# Patient Record
Sex: Male | Born: 1955 | Race: White | Hispanic: No | Marital: Single | Smoking: Current every day smoker
Health system: Southern US, Community
[De-identification: ages and names within clinical notes are randomized; demographics above are authoritative.]

## PROBLEM LIST (undated history)

## (undated) DIAGNOSIS — K219 Gastro-esophageal reflux disease without esophagitis: Secondary | ICD-10-CM

## (undated) DIAGNOSIS — M1990 Unspecified osteoarthritis, unspecified site: Secondary | ICD-10-CM

## (undated) DIAGNOSIS — K7460 Unspecified cirrhosis of liver: Secondary | ICD-10-CM

## (undated) DIAGNOSIS — I517 Cardiomegaly: Secondary | ICD-10-CM

## (undated) DIAGNOSIS — B182 Chronic viral hepatitis C: Secondary | ICD-10-CM

## (undated) DIAGNOSIS — I10 Essential (primary) hypertension: Secondary | ICD-10-CM

## (undated) DIAGNOSIS — Z8701 Personal history of pneumonia (recurrent): Secondary | ICD-10-CM

## (undated) DIAGNOSIS — E079 Disorder of thyroid, unspecified: Secondary | ICD-10-CM

## (undated) DIAGNOSIS — K8020 Calculus of gallbladder without cholecystitis without obstruction: Secondary | ICD-10-CM

## (undated) DIAGNOSIS — E7800 Pure hypercholesterolemia, unspecified: Secondary | ICD-10-CM

## (undated) DIAGNOSIS — F419 Anxiety disorder, unspecified: Secondary | ICD-10-CM

## (undated) DIAGNOSIS — J449 Chronic obstructive pulmonary disease, unspecified: Secondary | ICD-10-CM

## (undated) DIAGNOSIS — K279 Peptic ulcer, site unspecified, unspecified as acute or chronic, without hemorrhage or perforation: Secondary | ICD-10-CM

## (undated) DIAGNOSIS — F319 Bipolar disorder, unspecified: Secondary | ICD-10-CM

## (undated) HISTORY — DX: Gastro-esophageal reflux disease without esophagitis: K21.9

## (undated) HISTORY — DX: Calculus of gallbladder without cholecystitis without obstruction: K80.20

## (undated) HISTORY — PX: TONSILLECTOMY: SUR1361

## (undated) HISTORY — DX: Unspecified osteoarthritis, unspecified site: M19.90

## (undated) HISTORY — DX: Unspecified cirrhosis of liver: K74.60

## (undated) HISTORY — DX: Peptic ulcer, site unspecified, unspecified as acute or chronic, without hemorrhage or perforation: K27.9

## (undated) HISTORY — DX: Chronic viral hepatitis C: B18.2

## (undated) HISTORY — DX: Chronic obstructive pulmonary disease, unspecified: J44.9

## (undated) HISTORY — PX: ESOPHAGOGASTRODUODENOSCOPY (EGD) WITH ESOPHAGEAL DILATION: SHX5812

## (undated) HISTORY — PX: APPENDECTOMY: SHX54

## (undated) HISTORY — DX: Personal history of pneumonia (recurrent): Z87.01

## (undated) HISTORY — DX: Anxiety disorder, unspecified: F41.9

## (undated) HISTORY — DX: Disorder of thyroid, unspecified: E07.9

## (undated) HISTORY — PX: ABDOMINAL EXPLORATION SURGERY: SHX538

## (undated) HISTORY — DX: Bipolar disorder, unspecified: F31.9

---

## 1998-08-21 ENCOUNTER — Emergency Department (HOSPITAL_COMMUNITY): Admission: EM | Admit: 1998-08-21 | Discharge: 1998-08-21 | Payer: Self-pay | Admitting: Emergency Medicine

## 1998-10-02 ENCOUNTER — Emergency Department (HOSPITAL_COMMUNITY): Admission: EM | Admit: 1998-10-02 | Discharge: 1998-10-02 | Payer: Self-pay | Admitting: Emergency Medicine

## 1998-10-02 ENCOUNTER — Encounter: Payer: Self-pay | Admitting: Emergency Medicine

## 1999-11-09 ENCOUNTER — Emergency Department (HOSPITAL_COMMUNITY): Admission: EM | Admit: 1999-11-09 | Discharge: 1999-11-09 | Payer: Self-pay | Admitting: Internal Medicine

## 2005-06-02 ENCOUNTER — Emergency Department (HOSPITAL_COMMUNITY): Admission: EM | Admit: 2005-06-02 | Discharge: 2005-06-02 | Payer: Self-pay | Admitting: Emergency Medicine

## 2005-06-15 ENCOUNTER — Emergency Department (HOSPITAL_COMMUNITY): Admission: EM | Admit: 2005-06-15 | Discharge: 2005-06-15 | Payer: Self-pay | Admitting: Emergency Medicine

## 2005-11-18 ENCOUNTER — Emergency Department (HOSPITAL_COMMUNITY): Admission: EM | Admit: 2005-11-18 | Discharge: 2005-11-18 | Payer: Self-pay | Admitting: Emergency Medicine

## 2005-11-22 ENCOUNTER — Ambulatory Visit: Payer: Self-pay | Admitting: Family Medicine

## 2005-11-24 ENCOUNTER — Ambulatory Visit (HOSPITAL_COMMUNITY): Admission: RE | Admit: 2005-11-24 | Discharge: 2005-11-24 | Payer: Self-pay | Admitting: Sports Medicine

## 2005-12-01 ENCOUNTER — Ambulatory Visit: Payer: Self-pay | Admitting: Family Medicine

## 2011-08-10 HISTORY — PX: HIP SURGERY: SHX245

## 2013-08-09 DIAGNOSIS — Z8701 Personal history of pneumonia (recurrent): Secondary | ICD-10-CM

## 2013-08-09 HISTORY — DX: Personal history of pneumonia (recurrent): Z87.01

## 2016-04-02 ENCOUNTER — Encounter (HOSPITAL_COMMUNITY): Payer: Self-pay | Admitting: Vascular Surgery

## 2016-04-02 ENCOUNTER — Emergency Department (HOSPITAL_COMMUNITY)
Admission: EM | Admit: 2016-04-02 | Discharge: 2016-04-02 | Disposition: A | Discharge status: Home / Self Care | Payer: Medicaid Other | Attending: Emergency Medicine | Admitting: Emergency Medicine

## 2016-04-02 ENCOUNTER — Emergency Department (HOSPITAL_COMMUNITY): Payer: Medicaid Other

## 2016-04-02 DIAGNOSIS — I10 Essential (primary) hypertension: Secondary | ICD-10-CM | POA: Insufficient documentation

## 2016-04-02 DIAGNOSIS — M25562 Pain in left knee: Secondary | ICD-10-CM | POA: Insufficient documentation

## 2016-04-02 DIAGNOSIS — F17210 Nicotine dependence, cigarettes, uncomplicated: Secondary | ICD-10-CM | POA: Insufficient documentation

## 2016-04-02 HISTORY — DX: Cardiomegaly: I51.7

## 2016-04-02 HISTORY — DX: Pure hypercholesterolemia, unspecified: E78.00

## 2016-04-02 HISTORY — DX: Essential (primary) hypertension: I10

## 2016-04-02 MED ORDER — HYDROCODONE-ACETAMINOPHEN 5-325 MG PO TABS: 1.0000 | ORAL_TABLET | Freq: Four times a day (QID) | ORAL | 0 refills | Status: DC | PRN

## 2016-04-02 MED ORDER — HYDROCODONE-ACETAMINOPHEN 5-325 MG PO TABS
1.0000 | ORAL_TABLET | Freq: Once | ORAL | Status: AC
Start: 2016-04-02 — End: 2016-04-02
  Administered 2016-04-02: 1 via ORAL
  Filled 2016-04-02: qty 1

## 2016-04-02 NOTE — ED Triage Notes (Signed)
Pt reports to the ED for eval of left knee pain x 1 year. Pt reports he hurt it July 8th, 2016 but he was in prison so he was not able to get it checked. Pt denies any recent injury. Pt ambulatory without difficulty.

## 2016-04-02 NOTE — Discharge Instructions (Signed)
Read the information below.   Your knee x-ray shows deformity of the bone on the outer aspect of your knee and some fluid around the knee. You are being placed in a knee immobilizer and provided crutches. Please use until evaluated by orthopedics. Be sure to ice and elevate your leg. You can take motrin 400mg  every 6hrs for pain relief. I have prescribed vicodin for severe breakthrough pain. Do not drive after taking.  Use the prescribed medication as directed.  Please discuss all new medications with your pharmacist.  It is important that you follow up with Orthopedics. I have provided the contact information. Please call on Monday.  You may return to the Emergency Department at any time for worsening condition or any new symptoms that concern you. Return to the ED if you develop fever or your knee becomes red/hot/swollen or you are unable to bend your knee.

## 2016-04-02 NOTE — ED Notes (Signed)
Paged ortho tech 

## 2016-04-02 NOTE — Progress Notes (Signed)
Orthopedic Tech Progress Note Patient Details:  Mark ConnorsClarence N Marshall March 28, 1956 [id number]  Ortho Devices Type of Ortho Device: Crutches, Knee Immobilizer Ortho Device/Splint Location: LLE Ortho Device/Splint Interventions: Ordered, Application   Jennye MoccasinHughes, Lancer Thurner Craig 04/02/2016, 9:18 PM
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
## 2016-04-02 NOTE — ED Notes (Signed)
Pt stable, ambulatory, states understanding of discharge instructions 

## 2016-04-29 ENCOUNTER — Telehealth: Payer: Self-pay

## 2016-04-29 NOTE — Telephone Encounter (Signed)
APT. REMINDER CALL, NO ANSWER, NO VOICEMAIL °

## 2016-04-30 ENCOUNTER — Ambulatory Visit (INDEPENDENT_AMBULATORY_CARE_PROVIDER_SITE_OTHER): Payer: Self-pay | Admitting: Internal Medicine

## 2016-04-30 VITALS — BP 137/85 | HR 76 | Temp 98.1°F | Ht 71.0 in | Wt 191.7 lb

## 2016-04-30 DIAGNOSIS — K219 Gastro-esophageal reflux disease without esophagitis: Secondary | ICD-10-CM | POA: Insufficient documentation

## 2016-04-30 DIAGNOSIS — F17210 Nicotine dependence, cigarettes, uncomplicated: Secondary | ICD-10-CM

## 2016-04-30 DIAGNOSIS — I10 Essential (primary) hypertension: Secondary | ICD-10-CM

## 2016-04-30 DIAGNOSIS — B1920 Unspecified viral hepatitis C without hepatic coma: Secondary | ICD-10-CM

## 2016-04-30 DIAGNOSIS — F1121 Opioid dependence, in remission: Secondary | ICD-10-CM

## 2016-04-30 DIAGNOSIS — M1712 Unilateral primary osteoarthritis, left knee: Secondary | ICD-10-CM | POA: Insufficient documentation

## 2016-04-30 DIAGNOSIS — J189 Pneumonia, unspecified organism: Secondary | ICD-10-CM

## 2016-04-30 DIAGNOSIS — J449 Chronic obstructive pulmonary disease, unspecified: Secondary | ICD-10-CM

## 2016-04-30 DIAGNOSIS — Z801 Family history of malignant neoplasm of trachea, bronchus and lung: Secondary | ICD-10-CM

## 2016-04-30 DIAGNOSIS — J984 Other disorders of lung: Secondary | ICD-10-CM

## 2016-04-30 DIAGNOSIS — K279 Peptic ulcer, site unspecified, unspecified as acute or chronic, without hemorrhage or perforation: Secondary | ICD-10-CM | POA: Insufficient documentation

## 2016-04-30 DIAGNOSIS — K8020 Calculus of gallbladder without cholecystitis without obstruction: Secondary | ICD-10-CM | POA: Insufficient documentation

## 2016-04-30 DIAGNOSIS — E039 Hypothyroidism, unspecified: Secondary | ICD-10-CM | POA: Insufficient documentation

## 2016-04-30 DIAGNOSIS — F1421 Cocaine dependence, in remission: Secondary | ICD-10-CM

## 2016-04-30 DIAGNOSIS — Z8349 Family history of other endocrine, nutritional and metabolic diseases: Secondary | ICD-10-CM

## 2016-04-30 DIAGNOSIS — F3170 Bipolar disorder, currently in remission, most recent episode unspecified: Secondary | ICD-10-CM

## 2016-04-30 DIAGNOSIS — K7460 Unspecified cirrhosis of liver: Secondary | ICD-10-CM | POA: Insufficient documentation

## 2016-04-30 DIAGNOSIS — F1021 Alcohol dependence, in remission: Secondary | ICD-10-CM

## 2016-04-30 DIAGNOSIS — Z818 Family history of other mental and behavioral disorders: Secondary | ICD-10-CM

## 2016-04-30 DIAGNOSIS — Z833 Family history of diabetes mellitus: Secondary | ICD-10-CM

## 2016-04-30 DIAGNOSIS — F319 Bipolar disorder, unspecified: Secondary | ICD-10-CM

## 2016-04-30 DIAGNOSIS — F17200 Nicotine dependence, unspecified, uncomplicated: Secondary | ICD-10-CM

## 2016-04-30 DIAGNOSIS — Z79899 Other long term (current) drug therapy: Secondary | ICD-10-CM

## 2016-04-30 MED ORDER — QUETIAPINE FUMARATE 200 MG PO TABS: 200.0000 mg | ORAL_TABLET | Freq: Every day | ORAL | 0 refills | Status: DC

## 2016-04-30 MED ORDER — PRAZOSIN HCL 1 MG PO CAPS: 1.0000 mg | ORAL_CAPSULE | Freq: Every day | ORAL | 0 refills | Status: DC

## 2016-04-30 MED ORDER — OMEPRAZOLE 20 MG PO CPDR
20.0000 mg | DELAYED_RELEASE_CAPSULE | Freq: Every day | ORAL | 0 refills | Status: DC
Start: 2016-04-30 — End: 2016-05-11

## 2016-04-30 MED ORDER — LEVOTHYROXINE SODIUM 125 MCG PO TABS: 125.0000 ug | ORAL_TABLET | Freq: Every day | ORAL | 0 refills | Status: DC

## 2016-04-30 MED ORDER — LISINOPRIL 10 MG PO TABS: 20.0000 mg | ORAL_TABLET | Freq: Every day | ORAL | 0 refills | Status: DC

## 2016-04-30 MED ORDER — QUETIAPINE FUMARATE 50 MG PO TABS: 50.0000 mg | ORAL_TABLET | Freq: Every morning | ORAL | 0 refills | Status: DC

## 2016-04-30 MED ORDER — ALBUTEROL SULFATE HFA 108 (90 BASE) MCG/ACT IN AERS: 2.0000 | INHALATION_SPRAY | Freq: Four times a day (QID) | RESPIRATORY_TRACT | 0 refills | Status: DC | PRN

## 2016-04-30 MED ORDER — UMECLIDINIUM-VILANTEROL 62.5-25 MCG/INH IN AEPB: 1.0000 | INHALATION_SPRAY | Freq: Every day | RESPIRATORY_TRACT | 0 refills | Status: DC

## 2016-04-30 MED ORDER — PROPRANOLOL HCL 10 MG PO TABS: 10.0000 mg | ORAL_TABLET | Freq: Two times a day (BID) | ORAL | 0 refills | Status: DC
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
## 2016-04-30 NOTE — Progress Notes (Signed)
Patient was informed of the MedAssist medication assistance program and was provided forms. Pt signed forms and affirmed address and number correct. Will fax on behalf of patient.  Bea GraffLeah Dayleen Beske PharmD Candidate, c/o 2019 04/30/2016 3:46 PM

## 2016-04-30 NOTE — Assessment & Plan Note (Signed)
A: Patient was told by prison doctors that he has cirrhosis.  Unclear if due to Hep C or EtOH.  His discharge medication list from prison lists lactulose 20gm BID and propanolol 10mg  BID indicating possible varices.  He does not recall ever being told he has varices but he does report multiple EGD's while incarcerated. He has been without lactulose since release and reports doing well in terms of daily bowel movements.  There is no evidence of encephalopathy, asterixis, or jaundice on exam today.  CT abdomen in 2007 noted a normal appearing liver.  CT chest in 06/2014 made the comment of query hepatic cirrhosis.   P:  - continue propanolol 10mg  BID - consider restarting lactulose if needed in the future - would check a CMET, PT/INR at follow up once [employer] Card approved.  Would also consider abdominal ultrasound and referral to GI for consideration of EGD to assess varices depending on documentation and imaging results as well as time from last variceal screening if he does have cirrhosis.

## 2016-04-30 NOTE — Assessment & Plan Note (Signed)
A: Patient is currently followed by [facility] for this.  He brings in prescriptions for Quetiapine 50mg  QAM and 200mg  QHS.  He also is taking Prazosin 1mg  QHS also prescribed by [facility].  There is a strong family history of bipolar disorder and other mental health disease.  P: - continue follow up with [facility] for mental health services.

## 2016-04-30 NOTE — Assessment & Plan Note (Signed)
A: Likely contracted due to IV drug use prior to incarceration.  It appears he was treated with Harvoni while in prison.  P: - obtain records.  If no confirmation, would recheck a Hep C viral load to confirm treatment.

## 2016-04-30 NOTE — Assessment & Plan Note (Signed)
A: Patient is on omeprazole 20mg  daily.  He reports having EGD's performed in prison that showed gastric ulcers likely due to frequent NSAID use.  He needs a refill of this medication today.  P: - patient given a prescription to fill [facility]ep[facility]ole 20mg  [facility] until his [place] Med Assist can be filled out.

## 2016-04-30 NOTE — Assessment & Plan Note (Signed)
A:  Patient is on levothyroxine daily.  He brought this medication in with him today.  P: - continue levothyroxine daily - records request form filled out today to get records from his correctional facility  - if no recent TSH level, would recommend checking at follow up

## 2016-04-30 NOTE — Assessment & Plan Note (Signed)
A: Patient was on albuterol as needed while incarcerated.  He brought in paper work that indicates he was supposed to begin Anoro daily as well but was released prior to being given this.  He has been without any inhalers since his release several weeks ago.  We do not know what degree of COPD he has but does report an extensive smoking history.  He reports dyspnea with exertion that is currently stable.  P: - sample of albuterol given to patient to use as needed - start Anoro or other comparable LAMA/LABA or ICS/LABA for patient once [place] Med Assist approved. - consider PFTs in the future - smoking cessation advised

## 2016-04-30 NOTE — Assessment & Plan Note (Addendum)
BP Readings from Last 3 Encounters:  04/30/16 137/85  04/02/16 140/93   A:  BP here today is under control.  Discharge papers from his correctional facility indicate lisinopril 10mg  daily and HCTZ 25mg  daily.  He also is on propanolol 10mg  BID.  He is only currently taking the  lisinopril and propanolol.  He brought these prescriptions with him today.  P: - continue lisinopril 10mg  daily and propanolol 10mg  BID for now and will hold off on adding HCTZ back since access to new medications is an issue.  We had him meet with our pharmacists to [facility]. - follow up in 2-4 weeks and repeat BP - elected to wait on checking labs today as he is working to acquire the [employer] Card.  If he is [facility] to [facility], at follow up would recommend checking a complete metabolic panel to assess his kidney function and electrolytes given his long-term use of NSAIDs. - discussed briefly the hypertensive effects chronic NSAIDs can have.

## 2016-04-30 NOTE — Assessment & Plan Note (Signed)
A: Currently asymptomatic.  Noted on CT chest from 06/2014.  P: - continue to monitor.

## 2016-04-30 NOTE — Patient Instructions (Signed)
Mr.  Mark Marshall,  It was a pleasure to meet you today.    Please follow up with us in 2-4 weeks.  Hopefully, you will have obtained the [facility] at that point and we can assist with referral to appropriate specialists as needed.  We will work on getting you the medications that you need as well.  Please call us with any questions.  Take care,  [name]

## 2016-04-30 NOTE — Assessment & Plan Note (Signed)
A: Patient with many years of chronic left knee pain from previous injury.  Was evaluated in the ED last month where x-ray showed a large chronic osseous defect of the lateral tibial plateau at the site of previous fracture.  On exam, there is crepitus and obvious deformity of the joint indicative of OA.  He was told by prior doctors to avoid NSAIDs in light of his PUD.  He reports avoidance of ibuprofen but admits to taking Aleve every so often.  P: - I advised avoidance of Aleve as well.  Patient was unaware of the similar adverse effects between naproxen and ibuprofen.  Recommended using Tylenol as needed for now and continued ROM of the joint. - I do think he would benefit from an orthopedic evaluation to discuss joint replacement versus conservative management but we will have to wait until he is [facility].

## 2016-05-03 ENCOUNTER — Telehealth: Payer: Self-pay | Admitting: Student-PharmD

## 2016-05-03 NOTE — Progress Notes (Signed)
Internal Medicine Clinic Attending  Case discussed with Dr. Wallace at the time of the visit.  We reviewed the resident's history and exam and pertinent patient test results.  I agree with the assessment, diagnosis, and plan of care documented in the resident's note.  

## 2016-05-03 NOTE — Telephone Encounter (Signed)
Contacted [facility] for information on patient's medications:  Following medications were filled on 9/7 for a 30-day supply. [facility] staff member did mention that the pt may be responsible for the co-pays at next fill date:  Prazosin 1 mg ($14.41) Seroquel 50 mg ($7.97) Gabepentin 300 mg ($8.20) Seroquel 200 mg ($9.98)  Pt has been signed up for [facility] and can receive lisinopril, omeprazole, levothyroxine, quetiapine immediate-release, and albuterol through this program if approved.

## 2016-05-11 ENCOUNTER — Other Ambulatory Visit: Payer: Self-pay | Admitting: Pharmacist

## 2016-05-11 DIAGNOSIS — E039 Hypothyroidism, unspecified: Secondary | ICD-10-CM

## 2016-05-11 DIAGNOSIS — K219 Gastro-esophageal reflux disease without esophagitis: Secondary | ICD-10-CM

## 2016-05-11 DIAGNOSIS — I10 Essential (primary) hypertension: Secondary | ICD-10-CM

## 2016-05-11 DIAGNOSIS — J449 Chronic obstructive pulmonary disease, unspecified: Secondary | ICD-10-CM

## 2016-05-11 MED ORDER — LISINOPRIL 10 MG PO TABS: 20.0000 mg | ORAL_TABLET | Freq: Every day | ORAL | 0 refills | Status: DC

## 2016-05-11 MED ORDER — PROPRANOLOL HCL 10 MG PO TABS: 10.0000 mg | ORAL_TABLET | Freq: Two times a day (BID) | ORAL | 0 refills | Status: DC

## 2016-05-11 MED ORDER — ALBUTEROL SULFATE HFA 108 (90 BASE) MCG/ACT IN AERS: 2.0000 | INHALATION_SPRAY | Freq: Four times a day (QID) | RESPIRATORY_TRACT | 3 refills | Status: DC | PRN

## 2016-05-11 MED ORDER — OMEPRAZOLE 20 MG PO CPDR: 20.0000 mg | DELAYED_RELEASE_CAPSULE | Freq: Every day | ORAL | 0 refills | Status: DC

## 2016-05-11 NOTE — Progress Notes (Signed)
Patient approved for [facility]. Prescriptions transferred to pharmacy. 

## 2016-05-19 ENCOUNTER — Ambulatory Visit: Payer: Self-pay | Admitting: Pharmacist

## 2016-05-21 ENCOUNTER — Other Ambulatory Visit: Payer: Self-pay | Admitting: Pharmacist

## 2016-05-21 DIAGNOSIS — E039 Hypothyroidism, unspecified: Secondary | ICD-10-CM

## 2016-05-21 MED ORDER — LEVOTHYROXINE SODIUM 125 MCG PO TABS: 125.0000 ug | ORAL_TABLET | Freq: Every day | ORAL | 0 refills | Status: DC

## 2016-05-25 ENCOUNTER — Ambulatory Visit: Payer: Self-pay

## 2016-05-28 ENCOUNTER — Emergency Department (HOSPITAL_COMMUNITY): Payer: Medicaid Other

## 2016-05-28 ENCOUNTER — Inpatient Hospital Stay (HOSPITAL_COMMUNITY)
Admission: EM | Admit: 2016-05-28 | Discharge: 2016-06-03 | DRG: 494 | Disposition: A | Discharge status: Home / Self Care | Payer: Medicaid Other | Attending: Internal Medicine | Admitting: Internal Medicine

## 2016-05-28 ENCOUNTER — Other Ambulatory Visit: Payer: Self-pay

## 2016-05-28 ENCOUNTER — Encounter (HOSPITAL_COMMUNITY): Payer: Self-pay | Admitting: Emergency Medicine

## 2016-05-28 DIAGNOSIS — Z590 Homelessness: Secondary | ICD-10-CM

## 2016-05-28 DIAGNOSIS — S82852A Displaced trimalleolar fracture of left lower leg, initial encounter for closed fracture: Principal | ICD-10-CM | POA: Diagnosis present

## 2016-05-28 DIAGNOSIS — I10 Essential (primary) hypertension: Secondary | ICD-10-CM | POA: Diagnosis present

## 2016-05-28 DIAGNOSIS — Z419 Encounter for procedure for purposes other than remedying health state, unspecified: Secondary | ICD-10-CM

## 2016-05-28 DIAGNOSIS — Z791 Long term (current) use of non-steroidal anti-inflammatories (NSAID): Secondary | ICD-10-CM

## 2016-05-28 DIAGNOSIS — B1920 Unspecified viral hepatitis C without hepatic coma: Secondary | ICD-10-CM | POA: Diagnosis present

## 2016-05-28 DIAGNOSIS — F1010 Alcohol abuse, uncomplicated: Secondary | ICD-10-CM | POA: Diagnosis present

## 2016-05-28 DIAGNOSIS — Z79899 Other long term (current) drug therapy: Secondary | ICD-10-CM

## 2016-05-28 DIAGNOSIS — J449 Chronic obstructive pulmonary disease, unspecified: Secondary | ICD-10-CM | POA: Diagnosis present

## 2016-05-28 DIAGNOSIS — F17200 Nicotine dependence, unspecified, uncomplicated: Secondary | ICD-10-CM | POA: Diagnosis present

## 2016-05-28 DIAGNOSIS — E7800 Pure hypercholesterolemia, unspecified: Secondary | ICD-10-CM | POA: Diagnosis present

## 2016-05-28 DIAGNOSIS — K7460 Unspecified cirrhosis of liver: Secondary | ICD-10-CM | POA: Diagnosis present

## 2016-05-28 DIAGNOSIS — F319 Bipolar disorder, unspecified: Secondary | ICD-10-CM | POA: Diagnosis present

## 2016-05-28 DIAGNOSIS — F419 Anxiety disorder, unspecified: Secondary | ICD-10-CM | POA: Diagnosis present

## 2016-05-28 DIAGNOSIS — K219 Gastro-esophageal reflux disease without esophagitis: Secondary | ICD-10-CM | POA: Diagnosis present

## 2016-05-28 DIAGNOSIS — K279 Peptic ulcer, site unspecified, unspecified as acute or chronic, without hemorrhage or perforation: Secondary | ICD-10-CM | POA: Diagnosis present

## 2016-05-28 DIAGNOSIS — M1712 Unilateral primary osteoarthritis, left knee: Secondary | ICD-10-CM | POA: Diagnosis present

## 2016-05-28 DIAGNOSIS — G8911 Acute pain due to trauma: Secondary | ICD-10-CM

## 2016-05-28 DIAGNOSIS — S82845A Nondisplaced bimalleolar fracture of left lower leg, initial encounter for closed fracture: Secondary | ICD-10-CM | POA: Diagnosis present

## 2016-05-28 DIAGNOSIS — E876 Hypokalemia: Secondary | ICD-10-CM | POA: Diagnosis present

## 2016-05-28 DIAGNOSIS — B182 Chronic viral hepatitis C: Secondary | ICD-10-CM | POA: Diagnosis present

## 2016-05-28 DIAGNOSIS — W010XXA Fall on same level from slipping, tripping and stumbling without subsequent striking against object, initial encounter: Secondary | ICD-10-CM | POA: Diagnosis present

## 2016-05-28 DIAGNOSIS — E039 Hypothyroidism, unspecified: Secondary | ICD-10-CM | POA: Diagnosis present

## 2016-05-28 DIAGNOSIS — I517 Cardiomegaly: Secondary | ICD-10-CM | POA: Diagnosis present

## 2016-05-28 LAB — COMPREHENSIVE METABOLIC PANEL
ALBUMIN: 3.8 g/dL (ref 3.5–5.0)
ALK PHOS: 81 U/L (ref 38–126)
ALT: 20 U/L (ref 17–63)
AST: 32 U/L (ref 15–41)
Anion gap: 8 (ref 5–15)
BUN: 11 mg/dL (ref 6–20)
CO2: 22 mmol/L (ref 22–32)
Calcium: 9.3 mg/dL (ref 8.9–10.3)
Chloride: 106 mmol/L (ref 101–111)
Creatinine, Ser: 0.82 mg/dL (ref 0.61–1.24)
GFR calc Af Amer: >60 mL/min (ref >60)
GFR calc non Af Amer: >60 mL/min (ref >60)
GLUCOSE: 101 mg/dL — AB (ref 65–99)
Potassium: 3.6 mmol/L (ref 3.5–5.1)
Sodium: 136 mmol/L (ref 135–145)
Total Bilirubin: 1.1 mg/dL (ref 0.3–1.2)
Total Protein: 7.2 g/dL (ref 6.5–8.1)

## 2016-05-28 LAB — CBC WITH DIFFERENTIAL/PLATELET
Basophils Absolute: 0 10*3/uL (ref 0.0–0.1)
Basophils Relative: 0 %
EOS ABS: 0.1 10*3/uL (ref 0.0–0.7)
Eosinophils Relative: 2 %
HCT: 42.4 % (ref 39.0–52.0)
Hemoglobin: 14.2 g/dL (ref 13.0–17.0)
Lymphocytes Relative: 30 %
Lymphs Abs: 1.8 10*3/uL (ref 0.7–4.0)
MCH: 30.5 pg (ref 26.0–34.0)
MCHC: 33.5 g/dL (ref 30.0–36.0)
MCV: 91 fL (ref 78.0–100.0)
Monocytes Absolute: 0.8 10*3/uL (ref 0.1–1.0)
Monocytes Relative: 13 %
Neutro Abs: 3.4 10*3/uL (ref 1.7–7.7)
Neutrophils Relative %: 55 %
Platelets: 160 10*3/uL (ref 150–400)
RBC: 4.66 MIL/uL (ref 4.22–5.81)
RDW: 14 % (ref 11.5–15.5)
WBC: 6.1 10*3/uL (ref 4.0–10.5)

## 2016-05-28 LAB — ETHANOL: Alcohol, Ethyl (B): <5 mg/dL (ref <5)

## 2016-05-28 MED ORDER — HYDROMORPHONE HCL 1 MG/ML IJ SOLN
1.0000 mg | Freq: Once | INTRAMUSCULAR | Status: AC
  Administered 2016-05-28: 1 mg via INTRAVENOUS
  Filled 2016-05-28: qty 1

## 2016-05-28 MED ORDER — PROPOFOL 10 MG/ML IV BOLUS
INTRAVENOUS | Status: AC | PRN
  Administered 2016-05-28 (×2): 40 mg via INTRAVENOUS

## 2016-05-28 MED ORDER — HYDROMORPHONE HCL 2 MG/ML IJ SOLN
1.0000 mg | INTRAMUSCULAR | Status: AC | PRN
  Administered 2016-05-29: 1 mg via INTRAVENOUS
  Filled 2016-05-28: qty 1

## 2016-05-28 MED ORDER — HYDROMORPHONE HCL 2 MG/ML IJ SOLN
2.0000 mg | Freq: Once | INTRAMUSCULAR | Status: AC
  Administered 2016-05-28: 2 mg via INTRAVENOUS
  Filled 2016-05-28: qty 1

## 2016-05-28 MED ORDER — SODIUM CHLORIDE 0.9 % IV BOLUS (SEPSIS)
1000.0000 mL | Freq: Once | INTRAVENOUS | Status: AC
  Administered 2016-05-28: 1000 mL via INTRAVENOUS

## 2016-05-28 MED ORDER — PROPOFOL 10 MG/ML IV BOLUS
1.0000 mg/kg | Freq: Once | INTRAVENOUS | Status: AC
  Administered 2016-05-28: 80 mg via INTRAVENOUS
  Filled 2016-05-28: qty 20

## 2016-05-28 MED ORDER — SODIUM CHLORIDE 0.9 % IV BOLUS (SEPSIS)
500.0000 mL | Freq: Once | INTRAVENOUS | Status: AC
  Administered 2016-05-28: 500 mL via INTRAVENOUS

## 2016-05-28 MED ORDER — HYDROMORPHONE HCL 1 MG/ML IJ SOLN
1.0000 mg | INTRAMUSCULAR | Status: DC | PRN
  Administered 2016-05-28: 1 mg via INTRAVENOUS
  Filled 2016-05-28: qty 1

## 2016-05-28 MED ORDER — LISINOPRIL 20 MG PO TABS
20.0000 mg | ORAL_TABLET | Freq: Every day | ORAL | Status: DC
  Administered 2016-05-29 – 2016-06-03 (×6): 20 mg via ORAL
  Filled 2016-05-28 (×6): qty 1

## 2016-05-28 MED ORDER — ONDANSETRON HCL 4 MG/2ML IJ SOLN
4.0000 mg | Freq: Once | INTRAMUSCULAR | Status: AC
  Administered 2016-05-28: 4 mg via INTRAVENOUS
  Filled 2016-05-28: qty 2

## 2016-05-28 MED ORDER — QUETIAPINE FUMARATE 200 MG PO TABS
200.0000 mg | ORAL_TABLET | Freq: Every day | ORAL | Status: DC
  Administered 2016-05-29 – 2016-06-02 (×6): 200 mg via ORAL
  Filled 2016-05-28 (×8): qty 1

## 2016-05-28 MED ORDER — PRAZOSIN HCL 1 MG PO CAPS
1.0000 mg | ORAL_CAPSULE | Freq: Every day | ORAL | Status: DC
  Administered 2016-05-29 – 2016-06-02 (×5): 1 mg via ORAL
  Filled 2016-05-28 (×7): qty 1

## 2016-05-28 MED ORDER — PROPRANOLOL HCL 20 MG PO TABS
10.0000 mg | ORAL_TABLET | Freq: Two times a day (BID) | ORAL | Status: DC
  Administered 2016-05-29 – 2016-06-03 (×12): 10 mg via ORAL
  Filled 2016-05-28 (×12): qty 1

## 2016-05-28 MED ORDER — PANTOPRAZOLE SODIUM 40 MG PO TBEC
40.0000 mg | DELAYED_RELEASE_TABLET | Freq: Every day | ORAL | Status: DC
  Administered 2016-05-29 – 2016-06-03 (×6): 40 mg via ORAL
  Filled 2016-05-28 (×6): qty 1

## 2016-05-28 MED ORDER — LEVOTHYROXINE SODIUM 25 MCG PO TABS: 125.0000 ug | ORAL_TABLET | Freq: Every day | ORAL | Status: DC

## 2016-05-28 MED ORDER — ALBUTEROL SULFATE (2.5 MG/3ML) 0.083% IN NEBU: 2.5000 mg | INHALATION_SOLUTION | Freq: Four times a day (QID) | RESPIRATORY_TRACT | Status: DC | PRN

## 2016-05-28 MED ORDER — OXYCODONE-ACETAMINOPHEN 5-325 MG PO TABS
1.0000 | ORAL_TABLET | ORAL | Status: DC | PRN
  Administered 2016-05-29 (×2): 1 via ORAL
  Filled 2016-05-28: qty 1

## 2016-05-28 MED ORDER — ONDANSETRON HCL 4 MG/2ML IJ SOLN
4.0000 mg | Freq: Three times a day (TID) | INTRAMUSCULAR | Status: AC | PRN
  Administered 2016-05-28: 4 mg via INTRAVENOUS
  Filled 2016-05-28: qty 2

## 2016-05-28 MED ORDER — NAPROXEN 250 MG PO TABS: 250.0000 mg | ORAL_TABLET | Freq: Two times a day (BID) | ORAL | Status: DC | PRN

## 2016-05-28 MED ORDER — GABAPENTIN 300 MG PO CAPS
300.0000 mg | ORAL_CAPSULE | Freq: Three times a day (TID) | ORAL | Status: DC
  Administered 2016-05-29: 300 mg via ORAL
  Filled 2016-05-28: qty 1

## 2016-05-28 MED ORDER — ENOXAPARIN SODIUM 40 MG/0.4ML ~~LOC~~ SOLN
40.0000 mg | Freq: Every day | SUBCUTANEOUS | Status: DC
  Administered 2016-05-29 – 2016-06-02 (×6): 40 mg via SUBCUTANEOUS
  Filled 2016-05-28 (×6): qty 0.4

## 2016-05-28 MED ORDER — QUETIAPINE FUMARATE 50 MG PO TABS
50.0000 mg | ORAL_TABLET | Freq: Every morning | ORAL | Status: DC
  Administered 2016-05-29 – 2016-06-03 (×6): 50 mg via ORAL
  Filled 2016-05-28 (×6): qty 1

## 2016-05-28 MED ORDER — HYDROMORPHONE HCL 1 MG/ML IJ SOLN
1.0000 mg | Freq: Once | INTRAMUSCULAR | Status: AC
Start: 2016-05-28 — End: 2016-05-28
  Administered 2016-05-28: 1 mg via INTRAVENOUS
  Filled 2016-05-28: qty 1

## 2016-05-28 NOTE — ED Notes (Signed)
Patient transported to CT 

## 2016-05-28 NOTE — ED Notes (Signed)
[facility] ARRIVED TO TRANSPORT PT TO [facility] 13C-1. AAOX4. PT IN NO APPARENT DISTRESS WITH SEVERE PAIN 10/10, BUT STS THE PAIN IS STARTING TO EASE OFF A LITTLE. THE OPPORTUNITY TO ASK QUESTIONS WAS PROVIDED.

## 2016-05-28 NOTE — ED Triage Notes (Addendum)
Per EMS, patient was walking down wet grassy hill, patient slipped and fell. Twisted his left ankle. Obvious deformity noted left lower leg/ankle. Patient received 250 fentanyl IV en route with EMS. Patient rating pain 10/10

## 2016-05-28 NOTE — ED Notes (Signed)
Bed: [id number] Expected date:  Expected time:  Means of arrival:  Comments: EMS- fall, tib/fib fx
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
## 2016-05-28 NOTE — ED Notes (Signed)
Patient transported to X-ray 

## 2016-05-28 NOTE — Sedation Documentation (Signed)
Finished splint on leg. Pt then said "have we started?" Pt alert oriented, NAD. Feels better with splint in place.

## 2016-05-28 NOTE — Sedation Documentation (Signed)
Pt finally relaxed. PA and ortho tech reduced leg and placed splint

## 2016-05-28 NOTE — ED Notes (Signed)
FIRST ATTEMPT TO CALL REPORT TO [facility] 5N-13C-1.

## 2016-05-28 NOTE — ED Provider Notes (Signed)
Medical screening examination/treatment/procedure(s) were conducted as a shared visit with non-physician practitioner(s) and myself.  I personally evaluated the patient during the encounter.   EKG Interpretation None      60 year old male who presents after mechanical fall with left ankle pain and deformity. With history of left tibial plateau fracture, which he states was never managed by ortho and with chronic knee pain and knee instability. No other injuries. Extremity is neurovascularly in tact but with obviously deformity. X-ray with trimalleolar fracture. Sedated, reduced and splinted at bedside. Dr Aundria Rudogers consulted from ortho. Due to social factors and poor access to outpatient surgery, patient admitted to internal medicine service for Dr. Aundria Rudogers from ortho to perform operative repair.  Procedural sedation Performed by: Lavera Guiseana Duo [place] Viereck Consent: Verbal consent obtained. Risks and benefits: risks, benefits and alternatives were discussed Required items: required blood products, implants, devices, and special equipment available Patient identity confirmed: arm band and provided demographic data Time out: Immediately prior to procedure a "time out" was called to verify the correct patient, procedure, equipment, support staff and site/side marked as required.  Sedation type: moderate (conscious) sedation NPO time confirmed and considedered  Sedatives: PROPOFOL  Physician Time at Bedside: 25 minutes  Vitals: Vital signs were monitored during sedation. Cardiac Monitor, pulse oximeter Patient tolerance: Patient tolerated the procedure well with no immediate complications. Comments: Pt with uneventful recovered. Returned to pre-procedural sedation baseline     Reduction of dislocation Date/Time: 11:21 AM Performed by: Lavera Guiseana Duo Kaemon Barnett Authorized by: Lavera Guiseana Duo Charlynn Salih Consent: Verbal consent obtained. Risks and benefits: risks, benefits and alternatives were discussed Consent given by:  patient Required items: required blood products, implants, devices, and special equipment available Time out: Immediately prior to procedure a "time out" was called to verify the correct patient, procedure, equipment, support staff and site/side marked as required.  Patient sedated: propofol above  Vitals: Vital signs were monitored during sedation. Patient tolerance: Patient tolerated the procedure well with no immediate complications. Joint: left ankle Reduction technique: traction  SPLINT APPLICATION Date/Time: 5:49 PM Authorized by: Lavera Guiseana Duo Shironda Kain Consent: Verbal consent obtained. Risks and benefits: risks, benefits and alternatives were discussed Consent given by: patient Splint applied by: orthopedic technician Location details: left ankle Splint type: posterior mold Supplies used: fiberglass Post-procedure: The splinted body part was neurovascularly unchanged following the procedure. Patient tolerance: Patient tolerated the procedure well with no immediate complications.        Lavera Guiseana Duo Loeta Herst, MD 05/28/16 [phone]

## 2016-05-28 NOTE — ED Notes (Signed)
PA at bedside.

## 2016-05-28 NOTE — H&P (Signed)
Date: 05/28/2016               Patient Name:  Mark Marshall MRN: [id number]  DOB: November 17, 1955 Age / Sex: 60 y.o., male   PCP: Alm Bustard, MD         Medical Service: Internal Medicine Teaching Service         Attending Physician: Dr. Earl Lagos, MD    First Contact: Dr. Antony Contras Pager: [phone]  Second Contact: Dr. Earlene Plater Pager: [phone]       After Hours (After 5p/  First Contact Pager: [phone]  weekends / holidays): Second Contact Pager: [phone]   Chief Complaint: Left leg injury  History of Present Illness: Mark Marshall is a 60 year old male with PMHx of left knee osteoarthritis, COPD, Hepatitis C treated, hypothyroidism, bipolar, and homeless who presents to [facility] from [facility] after a left leg injury.  He states that his left knee gives him trouble and when walking down a hill he lost his balance because it was slippery and his left ankle was bent backwards.  He heard his ankle snap and experienced immediate pain reporting that his ankle was dangling off the end of the his leg.  He called an ambulance and was transferred to [facility].  His leg was splinted.  He also states he has been experiencing nausea and mild abdominal pain and hasn't eaten in 4 days except for half a case of mountain dew. He states he vomited once today when at [facility].  He denies any chest pain, vision changes, or dysuria.  Meds:  Current Meds  Medication Sig  . albuterol (PROVENTIL HFA;VENTOLIN HFA) 108 (90 Base) MCG/ACT inhaler Inhale 2 puffs into the lungs every 6 (six) hours as needed for wheezing or shortness of breath.  . gabapentin (NEURONTIN) 300 MG capsule Take 300 mg by mouth 3 (three) times daily.  Marland Kitchen levothyroxine (SYNTHROID) 125 MCG tablet Take 1 tablet (125 mcg total) by mouth daily.  Marland Kitchen lisinopril (PRINIVIL,ZESTRIL) 10 MG tablet Take 2 tablets (20 mg total) by mouth daily.  . naproxen sodium (ANAPROX) 220 MG tablet  Take 220-440 mg by mouth every 12 (twelve) hours as needed (for pain).  . Omega-3 Fatty Acids (FISH OIL PO) Take 1 capsule by mouth 2 (two) times a week.  Marland Kitchen omeprazole (PRILOSEC) 20 MG capsule Take 1 capsule (20 mg total) by mouth daily.  . prazosin (MINIPRESS) 1 MG capsule Take 1 capsule (1 mg total) by mouth at bedtime.  . propranolol (INDERAL) 10 MG tablet Take 1 tablet (10 mg total) by mouth 2 (two) times daily.  . QUEtiapine (SEROQUEL) 200 MG tablet Take 1 tablet (200 mg total) by mouth at bedtime.  Marland Kitchen QUEtiapine (SEROQUEL) 50 MG tablet Take 1 tablet (50 mg total) by mouth every morning.     Allergies: Allergies as of 05/28/2016  . (No Known Allergies)   Past Medical History:  Diagnosis Date  . Anxiety   . Arthritis   . Bipolar disorder (HCC)    managed by [facility]  . Cardiomegaly   . Chronic hepatitis C (HCC)    treated with Harvoni while incarcerated.  . Cirrhosis (HCC)    told by correctional facility providers he has cirrhosis.  Marland Kitchen COPD (chronic obstructive pulmonary disease) (HCC)   . Gall stones   . GERD (gastroesophageal reflux disease)   . Hx of bacterial pneumonia 2015  . Hypercholesteremia   . Hypertension   .  Peptic ulcer disease   . Thyroid disease     Family History:  Family History  Problem Relation Age of Onset  . Hypertension Mother   . Hypothyroidism Mother   . Mental illness Mother   . Diabetes Sister   . Hypertension Sister   . Hypothyroidism Sister   . Bipolar disorder Sister   . Hypertension Brother   . Cancer Brother   . Bipolar disorder Daughter   . Kidney disease Neg Hx      Social History: Tobacco use: 40 pack year smoking history Alcohol use:  3-4 drinks a week Illicit drug use:  Denies current use.  Reports heroine, crack cocaine in the past  Review of Systems: A complete ROS was negative except as per HPI.   Physical Exam: Blood pressure 155/92, pulse 81, temperature 97.7 F (36.5 C), temperature source Oral, resp. rate 24,  height 5\' 11"  (1.803 m), weight 191 lb (86.6 kg), SpO2 93 %. Vitals:   05/28/16 2000 05/28/16 2100 05/28/16 2241 05/28/16 2332  BP: (!) 126/104 155/92 (!) 151/119 (!) 145/76  Pulse: 83 81 78 82  Resp: 19 24 17 17   Temp:   97.5 F (36.4 C) 97.6 F (36.4 C)  TempSrc:   Oral Oral  SpO2: 100% 93% 99% 98%  Weight:    197 lb 8.5 oz (89.6 kg)  Height:    5\' 11"  (1.803 m)   General: Vital signs reviewed.  Patient is well-developed and well-nourished, in no acute distress and cooperative with exam.  Head: Normocephalic and atraumatic. Eyes: EOMI, conjunctivae normal, no scleral icterus.  Neck: Supple, trachea midline, normal ROM, no JVD. Cardiovascular: RRR, S1 normal, S2 normal, no murmurs, gallops, or rubs. Pulmonary/Chest: Clear to auscultation bilaterally, no wheezes, rales, or rhonchi. Abdominal: Soft, diffuse tenderness, non-distended, BS +, no masses, organomegaly, or guarding present.  Musculoskeletal: No joint deformities, erythema, or stiffness, ROM full and nontender. Extremities: left leg splinted and wrapped, left toes are mobile,  Left knee slightly swollen, no erythema, pulses symmetric and intact bilaterally. No cyanosis or clubbing. Skin: Warm, dry and intact. No rashes or erythema. Psychiatric: Normal mood and affect. speech and behavior is normal. Cognition and memory are normal.   CMP     Component Value Date/Time   NA 136 05/28/2016 1025   K 3.6 05/28/2016 1025   CL 106 05/28/2016 1025   CO2 22 05/28/2016 1025   GLUCOSE 101 (H) 05/28/2016 1025   BUN 11 05/28/2016 1025   CREATININE 0.82 05/28/2016 1025   CALCIUM 9.3 05/28/2016 1025   PROT 7.2 05/28/2016 1025   ALBUMIN 3.8 05/28/2016 1025   AST 32 05/28/2016 1025   ALT 20 05/28/2016 1025   ALKPHOS 81 05/28/2016 1025   BILITOT 1.1 05/28/2016 1025   GFRNONAA >60 05/28/2016 1025   GFRAA >60 05/28/2016 1025    CBC    Component Value Date/Time   WBC 6.1 05/28/2016 1025   RBC 4.66 05/28/2016 1025   HGB 14.2  05/28/2016 1025   HCT 42.4 05/28/2016 1025   PLT 160 05/28/2016 1025   MCV 91.0 05/28/2016 1025   MCH 30.5 05/28/2016 1025   MCHC 33.5 05/28/2016 1025   RDW 14.0 05/28/2016 1025   LYMPHSABS 1.8 05/28/2016 1025   MONOABS 0.8 05/28/2016 1025   EOSABS 0.1 05/28/2016 1025   BASOSABS 0.0 05/28/2016 1025    EKG: Normal Sinus Rhythym with no ST wave elevation or T wave inversion  X-ray Tibia/Fibula left FINDINGS: Displaced fractures noted the medial  lateral malleoli. Fracture the posterior malleolus cannot be excluded. Stable deformity lateral tibial plateau consistent with prior depressed fracture. Similar findings noted on prior MRI of 04/16/2015 .  IMPRESSION: 1. Displaced fracture of the medial and lateral malleoli. Posterior malleolus fracture cannot be excluded.  2. Deformity noted of the lateral tibial plateau consistent with previously identified depressed lateral tibial plateau fracture.  CT left ankle  IMPRESSION: 1. Weber C stage 4 trimalleolar fracture without dislocation. The configuration is considered unstable. No flexor tendon entrapment. 2. Degenerative chondral thinning in the tibiotalar joint. 3. Plantar calcaneal spur.   Assessment & Plan by Problem: Mark Marshall is a 60 year old male that presents with a left trimalleolar fracture after a mechanical fall that was reduced and splinted.  Due to social factors and poor access to outpatient surgery patient was admitted to the internal medicine service for Dr. Aundria Rud from ortho to perform operative repair.  Left Trimalleolar fracture of ankle, closed. Patient had a mechanical fall which resulted in his left trimalleolar fracture which will require operative repair.  At the bedside at [facility] his fracture was reduced and splinted. Planning for operative repair by Dr. Aundria Rud. - percocet Q4PRN - Dilaudid 1mg  Q4PRN  Osteoarthritis of Left knee Patient has chronic left knee pain as a result of pervious  injuries.  Plain films of the left knee on 04/02/16 showed Large chronic osseous defect of the lateral tibial plateau at the site of previous fracture.  He was told by prior doctors to avoid NSAIDs because of his PUD.   - voltaren gel   Hypertension Blood pressure are mildly elevated.  Patient has not received medications today.  Patient takes lisinopril 10mg  daily and propanolol 10mg   - Continue blood pressure medications  Hypothyroidism Stable.  States he's compliant on medications.  Patient is on levothyroxine daily.  There is no baseline TSH level in the EMR.  Will obtain prior to surgery. - TSH - Continue home meds  COPD Patient was on albuterol as needed while incarcerated.  Prior to leaving the prison he was supposed to start Anoro but has not obtained it.  He currently uses albuterol about 2 times a day when doing activities.  States he gets short of breath when walking short distances.  No PFTs are on file.  He is not wheezing on exam and stating well on room air.  We will start a COPD inhaler in addition to albuterol prior to surgery. - albuterol - Dulera  GERD Patient is on omeprazole 20mg  daily.  Patient reports having EGD's performed while in prison that showed gastric ulcers likely due to NSAID use.   - Continue Omeprazole  Hepatitis C Likely contracted due to IV drug use prior to incarceration.  Was treated with Harvoni.  May need to recheck a Hep C viral load to confirm treatment.  LFTs are normal. -HIV -Hep C viral load to confirm treatment  Hepatic Cirrhosis Patient was told by [facility] doctors he had cirrhosis.  CT chest in 06/2014 reported query hepatic cirrhosis.  Patient was discharged from prison with lactulose 20gm BID and propanolol BID indicating possible varices.  He does not take the lactulose.  LFTs are normal.  We will get a PT INR  -PT INR  Bipolar Disorder Stable.  On Seroquel  - continue home medications  Diet:  NPO for possible surgery  tomorrow, if not getting surgery can resume diet  DVT prophylaxis:  Sub Q lovenox Code status:  Full  Dispo: Admit patient to Observation with expected length of stay less than 2 midnights.  Signed: Camelia PhenesJessica Ratliff Quashon Jesus, DO 05/28/2016, 9:35 PM  Pager: [phone]

## 2016-05-29 DIAGNOSIS — F17210 Nicotine dependence, cigarettes, uncomplicated: Secondary | ICD-10-CM

## 2016-05-29 DIAGNOSIS — E039 Hypothyroidism, unspecified: Secondary | ICD-10-CM | POA: Diagnosis present

## 2016-05-29 DIAGNOSIS — F419 Anxiety disorder, unspecified: Secondary | ICD-10-CM | POA: Diagnosis present

## 2016-05-29 DIAGNOSIS — I10 Essential (primary) hypertension: Secondary | ICD-10-CM | POA: Diagnosis present

## 2016-05-29 DIAGNOSIS — W010XXA Fall on same level from slipping, tripping and stumbling without subsequent striking against object, initial encounter: Secondary | ICD-10-CM | POA: Diagnosis present

## 2016-05-29 DIAGNOSIS — K7460 Unspecified cirrhosis of liver: Secondary | ICD-10-CM | POA: Diagnosis present

## 2016-05-29 DIAGNOSIS — K279 Peptic ulcer, site unspecified, unspecified as acute or chronic, without hemorrhage or perforation: Secondary | ICD-10-CM | POA: Diagnosis present

## 2016-05-29 DIAGNOSIS — F319 Bipolar disorder, unspecified: Secondary | ICD-10-CM | POA: Diagnosis present

## 2016-05-29 DIAGNOSIS — E7800 Pure hypercholesterolemia, unspecified: Secondary | ICD-10-CM | POA: Diagnosis present

## 2016-05-29 DIAGNOSIS — B1920 Unspecified viral hepatitis C without hepatic coma: Secondary | ICD-10-CM | POA: Diagnosis present

## 2016-05-29 DIAGNOSIS — F1010 Alcohol abuse, uncomplicated: Secondary | ICD-10-CM | POA: Diagnosis present

## 2016-05-29 DIAGNOSIS — S82845A Nondisplaced bimalleolar fracture of left lower leg, initial encounter for closed fracture: Secondary | ICD-10-CM | POA: Diagnosis present

## 2016-05-29 DIAGNOSIS — Z791 Long term (current) use of non-steroidal anti-inflammatories (NSAID): Secondary | ICD-10-CM | POA: Diagnosis not present

## 2016-05-29 DIAGNOSIS — E876 Hypokalemia: Secondary | ICD-10-CM | POA: Diagnosis present

## 2016-05-29 DIAGNOSIS — J449 Chronic obstructive pulmonary disease, unspecified: Secondary | ICD-10-CM | POA: Diagnosis present

## 2016-05-29 DIAGNOSIS — S82852A Displaced trimalleolar fracture of left lower leg, initial encounter for closed fracture: Principal | ICD-10-CM

## 2016-05-29 DIAGNOSIS — M1711 Unilateral primary osteoarthritis, right knee: Secondary | ICD-10-CM

## 2016-05-29 DIAGNOSIS — Z590 Homelessness: Secondary | ICD-10-CM | POA: Diagnosis not present

## 2016-05-29 DIAGNOSIS — B182 Chronic viral hepatitis C: Secondary | ICD-10-CM | POA: Diagnosis present

## 2016-05-29 DIAGNOSIS — I517 Cardiomegaly: Secondary | ICD-10-CM | POA: Diagnosis present

## 2016-05-29 DIAGNOSIS — Z79899 Other long term (current) drug therapy: Secondary | ICD-10-CM | POA: Diagnosis not present

## 2016-05-29 DIAGNOSIS — K219 Gastro-esophageal reflux disease without esophagitis: Secondary | ICD-10-CM | POA: Diagnosis present

## 2016-05-29 DIAGNOSIS — F17200 Nicotine dependence, unspecified, uncomplicated: Secondary | ICD-10-CM | POA: Diagnosis present

## 2016-05-29 DIAGNOSIS — M1712 Unilateral primary osteoarthritis, left knee: Secondary | ICD-10-CM | POA: Diagnosis present

## 2016-05-29 LAB — HIV ANTIBODY (ROUTINE TESTING W REFLEX): HIV Screen 4th Generation wRfx: NONREACTIVE

## 2016-05-29 LAB — RAPID URINE DRUG SCREEN, HOSP PERFORMED
Amphetamines: NOT DETECTED
Barbiturates: NOT DETECTED
Benzodiazepines: NOT DETECTED
Cocaine: NOT DETECTED
Opiates: POSITIVE — AB
Tetrahydrocannabinol: NOT DETECTED

## 2016-05-29 LAB — TSH: TSH: 27.555 u[IU]/mL — AB (ref 0.350–4.500)

## 2016-05-29 LAB — PROTIME-INR
INR: 1.12
Prothrombin Time: 14.4 s (ref 11.4–15.2)

## 2016-05-29 MED ORDER — MOMETASONE FURO-FORMOTEROL FUM 100-5 MCG/ACT IN AERO
2.0000 | INHALATION_SPRAY | Freq: Two times a day (BID) | RESPIRATORY_TRACT | Status: DC
  Administered 2016-05-29 – 2016-06-03 (×8): 2 via RESPIRATORY_TRACT
  Filled 2016-05-29 (×2): qty 8.8

## 2016-05-29 MED ORDER — LEVOTHYROXINE SODIUM 75 MCG PO TABS
150.0000 ug | ORAL_TABLET | Freq: Every day | ORAL | Status: DC
  Administered 2016-05-29 – 2016-06-03 (×6): 150 ug via ORAL
  Filled 2016-05-29 (×6): qty 2

## 2016-05-29 MED ORDER — DEXTROSE-NACL 5-0.45 % IV SOLN
INTRAVENOUS | Status: DC
  Administered 2016-05-29: 08:00:00 via INTRAVENOUS

## 2016-05-29 MED ORDER — DICLOFENAC SODIUM 1 % TD GEL
4.0000 g | Freq: Four times a day (QID) | TRANSDERMAL | Status: DC
  Administered 2016-05-29 – 2016-06-03 (×13): 4 g via TOPICAL
  Filled 2016-05-29 (×3): qty 100

## 2016-05-29 MED ORDER — OXYCODONE HCL 5 MG PO TABS
5.0000 mg | ORAL_TABLET | ORAL | Status: DC | PRN
  Administered 2016-05-29 – 2016-06-01 (×9): 5 mg via ORAL
  Filled 2016-05-29 (×9): qty 1

## 2016-05-29 NOTE — Progress Notes (Signed)
Subjective:    Patient reports pain as 2 on 0-10 scale. Doing well .Circulation intact in his foot. He is waiting for DR. Rogers to evaluate for surgery.   Objective: Vital signs in last 24 hours: Temp:  [97.5 F (36.4 C)-97.9 F (36.6 C)] 97.9 F (36.6 C) (10/21 0448) Pulse Rate:  [68-102] 80 (10/21 0448) Resp:  [14-26] 18 (10/21 0448) BP: (90-172)/(67-119) 90/67 (10/21 0448) SpO2:  [93 %-100 %] 96 % (10/21 0448) Weight:  [86.6 kg (191 lb)-89.6 kg (197 lb 8.5 oz)] 89.6 kg (197 lb 8.5 oz) (10/20 2332)  Intake/Output from previous day: 10/20 0701 - 10/21 0700 In: 1500 [IV Piggyback:1500] Out: 150 [Urine:150] Intake/Output this shift: No intake/output data recorded.   Recent Labs  05/28/16 1025  HGB 14.2    Recent Labs  05/28/16 1025  WBC 6.1  RBC 4.66  HCT 42.4  PLT 160    Recent Labs  05/28/16 1025  NA 136  K 3.6  CL 106  CO2 22  BUN 11  CREATININE 0.82  GLUCOSE 101*  CALCIUM 9.3    Recent Labs  05/29/16 0053  INR 1.12    Neurovascular intact  Assessment/Plan:    Up with therapy Weight on his involved extremity.  Sieanna Vanstone A 05/29/2016, 8:23 AM

## 2016-05-29 NOTE — Progress Notes (Signed)
   Subjective: Patient sitting up in bed, pain is well controlled with PRNs. Inquiring about surgery, he has been NPO since midnight.  Objective:  Vital signs in last 24 hours: Vitals:   05/28/16 2100 05/28/16 2241 05/28/16 2332 05/29/16 0448  BP: 155/92 (!) 151/119 (!) 145/76 90/67  Pulse: 81 78 82 80  Resp: 24 17 17 18   Temp:  97.5 F (36.4 C) 97.6 F (36.4 C) 97.9 F (36.6 C)  TempSrc:  Oral Oral Oral  SpO2: 93% 99% 98% 96%  Weight:   197 lb 8.5 oz (89.6 kg)   Height:   5\' 11"  (1.803 m)    Physical Exam Constitutional: NAD, appears comfortable Cardiovascular: RRR, no murmurs, rubs, or gallops.  Pulmonary/Chest: CTAB, no wheezes, rales, or rhonchi.  Abdominal: Soft, non tender, non distended. +BS.  Extremities: Left leg splinted and wrapped, moves all toes spontaneously, warm and well perfused. Sensation intact.  Neurological: A&Ox3, CN II - XII grossly intact.  Skin: No rashes or erythema  Psychiatric: Normal mood and affect  Assessment/Plan:  Left Trimalleolar Fracture: After mechanical fall yesterday. [facility] and [facility] was [facility] here for [facility]. Operative repair pending evaluation by Dr. Aundria Rudogers.  -- Ortho consult, appreciate recommendations  -- NPO since midnight pending surgery recommendations  -- Percocet q4hrs PRN -- Dilaudid 1 mg IV q4hrs PRN -- Zofran 4mg   Osteoarthritis of Left knee: Patient has chronic left knee pain as a result of pervious injuries.  Plain films of the left knee on 04/02/16 showed Large chronic osseous defect of the lateral tibial plateau at the site of previous fracture.  He was told by prior doctors to avoid NSAIDs because of his PUD.   -- Voltaren gel   Hypertension:  -- Continue home Lisinopril 10 mg and propanolol 10 mg daily   Hypothyroidism: TSH elevated 27.5 this admission, unclear significant in acute setting.  -- Synthroid dose increased from 125 -> 150 mcg daily  -- Recheck TSH  4-6 weeks  COPD: Patient was on albuterol as needed while incarcerated.  Prior to leaving the prison he was supposed to start Anoro but has not obtained it. No wheezing on exam and patient is stating well on room air.   -- Continue albuterol nebs q6 hrs PRN -- Start Dulera BID  GERD: Patient is on omeprazole 20mg  daily. Patient reports having EGD's performed while in prison significant for gastric ulcers, likely due to NSAID use.   -- Protonix 40 mg daily   Hepatitis C: History of IVDU and cirrhosis.  Was treated with Harvoni.  May need to recheck a Hep C viral load to confirm cure.  LFTs are normal. -- HIV antibodies pending  -- Hep C viral load pending   Bipolar Disorder: -- Continue home Seroquel   FEN: No fluids, replete lytes prn, NPO since midnight  VTE ppx: Lovenox  Code Status: FULL    Dispo: Anticipated discharge pending surgery and ortho recommendations.   Reymundo Pollarolyn Mikaylah Libbey, MD 05/29/2016, 12:00 PM Pager: [phone]
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
## 2016-05-30 ENCOUNTER — Inpatient Hospital Stay (HOSPITAL_COMMUNITY): Payer: Medicaid Other | Admitting: Anesthesiology

## 2016-05-30 ENCOUNTER — Encounter (HOSPITAL_COMMUNITY): Payer: Self-pay | Admitting: Anesthesiology

## 2016-05-30 ENCOUNTER — Other Ambulatory Visit: Payer: Self-pay

## 2016-05-30 ENCOUNTER — Inpatient Hospital Stay (HOSPITAL_COMMUNITY): Payer: Medicaid Other

## 2016-05-30 ENCOUNTER — Encounter (HOSPITAL_COMMUNITY)
Admission: EM | Disposition: A | Discharge status: Home / Self Care | Payer: Self-pay | Source: Home / Self Care | Attending: Internal Medicine

## 2016-05-30 DIAGNOSIS — W010XXD Fall on same level from slipping, tripping and stumbling without subsequent striking against object, subsequent encounter: Secondary | ICD-10-CM

## 2016-05-30 HISTORY — PX: ORIF ANKLE FRACTURE: SHX5408

## 2016-05-30 LAB — BASIC METABOLIC PANEL
ANION GAP: 6 (ref 5–15)
BUN: 5 mg/dL — ABNORMAL LOW (ref 6–20)
CALCIUM: 8.6 mg/dL — AB (ref 8.9–10.3)
CHLORIDE: 106 mmol/L (ref 101–111)
CO2: 26 mmol/L (ref 22–32)
CREATININE: 0.88 mg/dL (ref 0.61–1.24)
GFR calc non Af Amer: >60 mL/min (ref >60)
Glucose, Bld: 101 mg/dL — ABNORMAL HIGH (ref 65–99)
Potassium: 3.4 mmol/L — ABNORMAL LOW (ref 3.5–5.1)
SODIUM: 138 mmol/L (ref 135–145)

## 2016-05-30 LAB — CBC
HCT: 35 % — ABNORMAL LOW (ref 39.0–52.0)
HEMOGLOBIN: 11.4 g/dL — AB (ref 13.0–17.0)
MCH: 29.6 pg (ref 26.0–34.0)
MCHC: 32.6 g/dL (ref 30.0–36.0)
MCV: 90.9 fL (ref 78.0–100.0)
Platelets: 114 10*3/uL — ABNORMAL LOW (ref 150–400)
RBC: 3.85 MIL/uL — ABNORMAL LOW (ref 4.22–5.81)
RDW: 13.9 % (ref 11.5–15.5)
WBC: 5.3 10*3/uL (ref 4.0–10.5)

## 2016-05-30 LAB — HCV RNA QUANT: HCV Quantitative: NOT DETECTED IU/mL (ref >50)

## 2016-05-30 LAB — SURGICAL PCR SCREEN
MRSA, PCR: POSITIVE — AB
STAPHYLOCOCCUS AUREUS: POSITIVE — AB

## 2016-05-30 SURGERY — OPEN REDUCTION INTERNAL FIXATION (ORIF) ANKLE FRACTURE
Anesthesia: General | Laterality: Left

## 2016-05-30 MED ORDER — FOLIC ACID 1 MG PO TABS
1.0000 mg | ORAL_TABLET | Freq: Every day | ORAL | Status: DC
  Administered 2016-05-30 – 2016-06-03 (×5): 1 mg via ORAL
  Filled 2016-05-30 (×5): qty 1

## 2016-05-30 MED ORDER — PROMETHAZINE HCL 25 MG/ML IJ SOLN: 6.2500 mg | INTRAMUSCULAR | Status: DC | PRN

## 2016-05-30 MED ORDER — THIAMINE HCL 100 MG/ML IJ SOLN: 100.0000 mg | Freq: Every day | INTRAMUSCULAR | Status: DC

## 2016-05-30 MED ORDER — CHLORHEXIDINE GLUCONATE 4 % EX LIQD: 60.0000 mL | Freq: Once | CUTANEOUS | Status: DC

## 2016-05-30 MED ORDER — PROPOFOL 10 MG/ML IV BOLUS
INTRAVENOUS | Status: AC
  Filled 2016-05-30: qty 20

## 2016-05-30 MED ORDER — ONDANSETRON HCL 4 MG/2ML IJ SOLN
INTRAMUSCULAR | Status: AC
  Filled 2016-05-30: qty 2

## 2016-05-30 MED ORDER — MIDAZOLAM HCL 2 MG/2ML IJ SOLN
INTRAMUSCULAR | Status: AC
  Filled 2016-05-30: qty 2

## 2016-05-30 MED ORDER — HYDROMORPHONE HCL 1 MG/ML IJ SOLN: 0.2500 mg | INTRAMUSCULAR | Status: DC | PRN

## 2016-05-30 MED ORDER — CEFAZOLIN SODIUM-DEXTROSE 2-4 GM/100ML-% IV SOLN: 2.0000 g | INTRAVENOUS | Status: DC

## 2016-05-30 MED ORDER — CHLORHEXIDINE GLUCONATE CLOTH 2 % EX PADS
6.0000 | MEDICATED_PAD | Freq: Every day | CUTANEOUS | Status: DC
  Administered 2016-05-31 – 2016-06-03 (×4): 6 via TOPICAL

## 2016-05-30 MED ORDER — PROPOFOL 10 MG/ML IV BOLUS
INTRAVENOUS | Status: DC | PRN
  Administered 2016-05-30 (×2): 30 mg via INTRAVENOUS
  Administered 2016-05-30: 170 mg via INTRAVENOUS
  Administered 2016-05-30: 30 mg via INTRAVENOUS

## 2016-05-30 MED ORDER — PHENYLEPHRINE HCL 10 MG/ML IJ SOLN
INTRAMUSCULAR | Status: DC | PRN
  Administered 2016-05-30 (×4): 80 ug via INTRAVENOUS

## 2016-05-30 MED ORDER — PHENYLEPHRINE 40 MCG/ML (10ML) SYRINGE FOR IV PUSH (FOR BLOOD PRESSURE SUPPORT)
PREFILLED_SYRINGE | INTRAVENOUS | Status: AC
  Filled 2016-05-30: qty 10

## 2016-05-30 MED ORDER — METOCLOPRAMIDE HCL 5 MG PO TABS: 5.0000 mg | ORAL_TABLET | Freq: Three times a day (TID) | ORAL | Status: DC | PRN

## 2016-05-30 MED ORDER — FENTANYL CITRATE (PF) 100 MCG/2ML IJ SOLN
INTRAMUSCULAR | Status: AC
  Filled 2016-05-30: qty 2

## 2016-05-30 MED ORDER — METOCLOPRAMIDE HCL 5 MG/ML IJ SOLN: 5.0000 mg | Freq: Three times a day (TID) | INTRAMUSCULAR | Status: DC | PRN

## 2016-05-30 MED ORDER — BUPIVACAINE HCL (PF) 0.25 % IJ SOLN
INTRAMUSCULAR | Status: DC | PRN
  Administered 2016-05-30: 20 mL

## 2016-05-30 MED ORDER — BUPIVACAINE HCL (PF) 0.25 % IJ SOLN
INTRAMUSCULAR | Status: AC
  Filled 2016-05-30: qty 30

## 2016-05-30 MED ORDER — LACTATED RINGERS IV SOLN
INTRAVENOUS | Status: DC | PRN
Start: 2016-05-30 — End: 2016-05-30
  Administered 2016-05-30 (×2): via INTRAVENOUS

## 2016-05-30 MED ORDER — FENTANYL CITRATE (PF) 100 MCG/2ML IJ SOLN
INTRAMUSCULAR | Status: DC | PRN
  Administered 2016-05-30: 50 ug via INTRAVENOUS
  Administered 2016-05-30: 100 ug via INTRAVENOUS
  Administered 2016-05-30 (×3): 50 ug via INTRAVENOUS

## 2016-05-30 MED ORDER — MUPIROCIN 2 % EX OINT
1.0000 | TOPICAL_OINTMENT | Freq: Two times a day (BID) | CUTANEOUS | Status: DC
Start: 2016-05-30 — End: 2016-06-03
  Administered 2016-05-30 – 2016-06-02 (×7): 1 via NASAL
  Filled 2016-05-30: qty 22

## 2016-05-30 MED ORDER — LIDOCAINE HCL (CARDIAC) 20 MG/ML IV SOLN
INTRAVENOUS | Status: DC | PRN
  Administered 2016-05-30: 80 mg via INTRAVENOUS

## 2016-05-30 MED ORDER — POVIDONE-IODINE 10 % EX SWAB: 2.0000 "application " | Freq: Once | CUTANEOUS | Status: DC

## 2016-05-30 MED ORDER — CLINDAMYCIN PHOSPHATE 600 MG/50ML IV SOLN
600.0000 mg | Freq: Four times a day (QID) | INTRAVENOUS | Status: AC
  Administered 2016-05-30 (×3): 600 mg via INTRAVENOUS
  Filled 2016-05-30 (×6): qty 50

## 2016-05-30 MED ORDER — EPHEDRINE 5 MG/ML INJ
INTRAVENOUS | Status: AC
  Filled 2016-05-30: qty 10

## 2016-05-30 MED ORDER — ENOXAPARIN SODIUM 40 MG/0.4ML ~~LOC~~ SOLN: 40.0000 mg | SUBCUTANEOUS | Status: DC

## 2016-05-30 MED ORDER — ONDANSETRON HCL 4 MG/2ML IJ SOLN: 4.0000 mg | Freq: Four times a day (QID) | INTRAMUSCULAR | Status: DC | PRN

## 2016-05-30 MED ORDER — ONDANSETRON HCL 4 MG/2ML IJ SOLN
INTRAMUSCULAR | Status: DC | PRN
  Administered 2016-05-30: 4 mg via INTRAVENOUS

## 2016-05-30 MED ORDER — SUCCINYLCHOLINE CHLORIDE 20 MG/ML IJ SOLN
INTRAMUSCULAR | Status: DC | PRN
  Administered 2016-05-30: 100 mg via INTRAVENOUS

## 2016-05-30 MED ORDER — MIDAZOLAM HCL 5 MG/5ML IJ SOLN
INTRAMUSCULAR | Status: DC | PRN
  Administered 2016-05-30: 2 mg via INTRAVENOUS

## 2016-05-30 MED ORDER — 0.9 % SODIUM CHLORIDE (POUR BTL) OPTIME
TOPICAL | Status: DC | PRN
  Administered 2016-05-30: 1000 mL

## 2016-05-30 MED ORDER — MUPIROCIN 2 % EX OINT
TOPICAL_OINTMENT | Freq: Two times a day (BID) | CUTANEOUS | Status: DC
  Administered 2016-05-30: 14:00:00 via NASAL
  Filled 2016-05-30: qty 22

## 2016-05-30 MED ORDER — ONDANSETRON HCL 4 MG PO TABS: 4.0000 mg | ORAL_TABLET | Freq: Four times a day (QID) | ORAL | Status: DC | PRN

## 2016-05-30 MED ORDER — LIDOCAINE 2% (20 MG/ML) 5 ML SYRINGE
INTRAMUSCULAR | Status: AC
  Filled 2016-05-30: qty 5

## 2016-05-30 MED ORDER — LORAZEPAM 2 MG/ML IJ SOLN
1.0000 mg | Freq: Four times a day (QID) | INTRAMUSCULAR | Status: AC | PRN
  Administered 2016-05-30: 1 mg via INTRAVENOUS
  Filled 2016-05-30: qty 1

## 2016-05-30 MED ORDER — LORAZEPAM 1 MG PO TABS
1.0000 mg | ORAL_TABLET | Freq: Four times a day (QID) | ORAL | Status: AC | PRN
  Administered 2016-05-30 – 2016-06-01 (×2): 1 mg via ORAL
  Filled 2016-05-30 (×2): qty 1

## 2016-05-30 MED ORDER — EPHEDRINE SULFATE 50 MG/ML IJ SOLN
INTRAMUSCULAR | Status: DC | PRN
  Administered 2016-05-30: 10 mg via INTRAVENOUS

## 2016-05-30 MED ORDER — CEFAZOLIN SODIUM 1 G IJ SOLR
INTRAMUSCULAR | Status: AC
  Filled 2016-05-30: qty 20

## 2016-05-30 MED ORDER — CEFAZOLIN SODIUM 1 G IJ SOLR
INTRAMUSCULAR | Status: DC | PRN
  Administered 2016-05-30: 2 g via INTRAMUSCULAR

## 2016-05-30 MED ORDER — ACETAMINOPHEN 325 MG PO TABS: 650.0000 mg | ORAL_TABLET | Freq: Four times a day (QID) | ORAL | Status: DC | PRN

## 2016-05-30 MED ORDER — FENTANYL CITRATE (PF) 100 MCG/2ML IJ SOLN
INTRAMUSCULAR | Status: AC
  Filled 2016-05-30: qty 4

## 2016-05-30 MED ORDER — ADULT MULTIVITAMIN W/MINERALS CH
1.0000 | ORAL_TABLET | Freq: Every day | ORAL | Status: DC
  Administered 2016-05-30 – 2016-06-03 (×5): 1 via ORAL
  Filled 2016-05-30 (×5): qty 1

## 2016-05-30 MED ORDER — VITAMIN B-1 100 MG PO TABS
100.0000 mg | ORAL_TABLET | Freq: Every day | ORAL | Status: DC
  Administered 2016-05-30 – 2016-06-03 (×5): 100 mg via ORAL
  Filled 2016-05-30 (×5): qty 1

## 2016-05-30 MED ORDER — ACETAMINOPHEN 650 MG RE SUPP: 650.0000 mg | Freq: Four times a day (QID) | RECTAL | Status: DC | PRN

## 2016-05-30 SURGICAL SUPPLY — 74 items
BANDAGE ACE 4X5 VEL STRL LF (GAUZE/BANDAGES/DRESSINGS) ×3 IMPLANT
BANDAGE ACE 6X5 VEL STRL LF (GAUZE/BANDAGES/DRESSINGS) ×3 IMPLANT
BANDAGE ESMARK 6X9 LF (GAUZE/BANDAGES/DRESSINGS) IMPLANT
BIT DRILL 2.5X110 QC LCP DISP (BIT) ×3 IMPLANT
BIT DRILL CANN 2.7X625 NONSTRL (BIT) ×3 IMPLANT
BIT DRILL QC 3.5X110 (BIT) ×3 IMPLANT
BLADE SURG 15 STRL LF DISP TIS (BLADE) ×1 IMPLANT
BLADE SURG 15 STRL SS (BLADE) ×2
BNDG COHESIVE 4X5 TAN STRL (GAUZE/BANDAGES/DRESSINGS) IMPLANT
BNDG COHESIVE 6X5 TAN STRL LF (GAUZE/BANDAGES/DRESSINGS) IMPLANT
BNDG ESMARK 6X9 LF (GAUZE/BANDAGES/DRESSINGS)
CANISTER SUCT 3000ML PPV (MISCELLANEOUS) ×3 IMPLANT
COVER SURGICAL LIGHT HANDLE (MISCELLANEOUS) ×3 IMPLANT
CUFF TOURNIQUET SINGLE 34IN LL (TOURNIQUET CUFF) ×3 IMPLANT
CUFF TOURNIQUET SINGLE 44IN (TOURNIQUET CUFF) IMPLANT
DRAPE C-ARM 42X72 X-RAY (DRAPES) ×3 IMPLANT
DRAPE C-ARMOR (DRAPES) ×3 IMPLANT
DRAPE IMP U-DRAPE 54X76 (DRAPES) ×3 IMPLANT
DRAPE INCISE IOBAN 66X45 STRL (DRAPES) IMPLANT
DRAPE U-SHAPE 47X51 STRL (DRAPES) ×3 IMPLANT
DURAPREP 26ML APPLICATOR (WOUND CARE) ×3 IMPLANT
ELECT CAUTERY BLADE 6.4 (BLADE) ×3 IMPLANT
ELECT REM PT RETURN 9FT ADLT (ELECTROSURGICAL) ×3
ELECTRODE REM PT RTRN 9FT ADLT (ELECTROSURGICAL) ×1 IMPLANT
FACESHIELD WRAPAROUND (MASK) IMPLANT
GAUZE SPONGE 4X4 12PLY STRL (GAUZE/BANDAGES/DRESSINGS) ×3 IMPLANT
GAUZE XEROFORM 5X9 LF (GAUZE/BANDAGES/DRESSINGS) ×3 IMPLANT
GLOVE BIO SURGEON STRL SZ7.5 (GLOVE) ×3 IMPLANT
GLOVE BIOGEL PI IND STRL 8 (GLOVE) ×1 IMPLANT
GLOVE BIOGEL PI INDICATOR 8 (GLOVE) ×2
GOWN STRL REUS W/ TWL LRG LVL3 (GOWN DISPOSABLE) ×2 IMPLANT
GOWN STRL REUS W/ TWL XL LVL3 (GOWN DISPOSABLE) ×1 IMPLANT
GOWN STRL REUS W/TWL LRG LVL3 (GOWN DISPOSABLE) ×4
GOWN STRL REUS W/TWL XL LVL3 (GOWN DISPOSABLE) ×2
GUIDEWIRE THREADED 150MM (WIRE) ×3 IMPLANT
KIT BASIN OR (CUSTOM PROCEDURE TRAY) ×3 IMPLANT
KIT ROOM TURNOVER OR (KITS) ×3 IMPLANT
NEEDLE HYPO 25GX1X1/2 BEV (NEEDLE) ×3 IMPLANT
NS IRRIG 1000ML POUR BTL (IV SOLUTION) ×3 IMPLANT
PACK ORTHO EXTREMITY (CUSTOM PROCEDURE TRAY) ×3 IMPLANT
PAD ARMBOARD 7.5X6 YLW CONV (MISCELLANEOUS) ×6 IMPLANT
PAD CAST 3X4 CTTN HI CHSV (CAST SUPPLIES) IMPLANT
PADDING CAST ABS 4INX4YD NS (CAST SUPPLIES) ×2
PADDING CAST ABS COTTON 4X4 ST (CAST SUPPLIES) ×1 IMPLANT
PADDING CAST COTTON 3X4 STRL (CAST SUPPLIES)
PADDING CAST COTTON 6X4 STRL (CAST SUPPLIES) IMPLANT
PLATE LCP 3.5 1/3 TUB 7HX81 (Plate) ×3 IMPLANT
SCREW CANC FT ST SFS 4X16 (Screw) ×3 IMPLANT
SCREW CANC FT/18 4.0 (Screw) ×3 IMPLANT
SCREW CORTEX 3.5 16MM (Screw) ×4 IMPLANT
SCREW CORTEX 3.5 26MM (Screw) ×2 IMPLANT
SCREW CORTEX 3.5 30MM (Screw) ×2 IMPLANT
SCREW LOCK CORT ST 3.5X16 (Screw) ×2 IMPLANT
SCREW LOCK CORT ST 3.5X26 (Screw) ×1 IMPLANT
SCREW LOCK CORT ST 3.5X30 (Screw) ×1 IMPLANT
SCREW LOCK T15 FT 14X3.5X2.9X (Screw) ×1 IMPLANT
SCREW LOCKING 3.5X14 (Screw) ×2 IMPLANT
SPLINT PLASTER CAST XFAST 5X30 (CAST SUPPLIES) ×1 IMPLANT
SPLINT PLASTER XFAST SET 5X30 (CAST SUPPLIES) ×2
SPONGE LAP 18X18 X RAY DECT (DISPOSABLE) IMPLANT
STAPLER VISISTAT 35W (STAPLE) IMPLANT
SUCTION FRAZIER HANDLE 10FR (MISCELLANEOUS) ×2
SUCTION TUBE FRAZIER 10FR DISP (MISCELLANEOUS) ×1 IMPLANT
SUT ETHILON 2 0 FS 18 (SUTURE) ×6 IMPLANT
SUT MON AB 2-0 CT1 36 (SUTURE) IMPLANT
SUT VIC AB 2-0 CT1 27 (SUTURE) ×2
SUT VIC AB 2-0 CT1 TAPERPNT 27 (SUTURE) ×1 IMPLANT
SYR CONTROL 10ML LL (SYRINGE) IMPLANT
TAP CANNULATED 4.5 (TAP) ×6 IMPLANT
TOWEL OR 17X24 6PK STRL BLUE (TOWEL DISPOSABLE) ×3 IMPLANT
TOWEL OR 17X26 10 PK STRL BLUE (TOWEL DISPOSABLE) ×6 IMPLANT
TUBE CONNECTING 12'X1/4 (SUCTIONS) ×1
TUBE CONNECTING 12X1/4 (SUCTIONS) ×2 IMPLANT
WATER STERILE IRR 1000ML POUR (IV SOLUTION) ×3 IMPLANT

## 2016-05-30 NOTE — Transfer of Care (Signed)
Immediate Anesthesia Transfer of Care Note  Patient: Mark Marshall  Procedure(s) Performed: Procedure(s): OPEN REDUCTION INTERNAL FIXATION (ORIF) ANKLE FRACTURE (Left)  Patient Location: PACU  Anesthesia Type:General  Level of Consciousness: awake, oriented and patient cooperative  Airway & Oxygen Therapy: Patient Spontanous Breathing and Patient connected to nasal cannula oxygen  Post-op Assessment: Report given to RN and Post -op Vital signs reviewed and stable  Post vital signs: Reviewed  Last Vitals:  Vitals:   05/29/16 2113 05/30/16 0507  BP: 120/72 (!) 141/83  Pulse: 84 90  Resp:    Temp:  36.9 C    Last Pain:  Vitals:   05/30/16 0521  TempSrc:   PainSc: 2       Patients Stated Pain Goal: 2 (05/29/16 0206)  Complications: No apparent anesthesia complications

## 2016-05-30 NOTE — H&P (Signed)
H&P update  The surgical history has been reviewed and remains accurate without interval change.  The patient was re-examined and patient's physiologic condition has not changed significantly in the last 30 days. The condition still exists that makes this procedure necessary. The treatment plan remains the same, without new options for care.  No new pharmacological allergies or types of therapy has been initiated that would change the plan or the appropriateness of the plan.  The patient and/or family understand the potential benefits and risks.  Nikki Glanzer P. Aundria Rudogers, MD 05/30/2016 7:17 AM

## 2016-05-30 NOTE — Progress Notes (Signed)
Called OR, spoke to Mark Marshall, to let them know patient's nasal swab is positive for MRSA

## 2016-05-30 NOTE — Op Note (Signed)
   Date of Surgery: 05/30/2016  INDICATIONS: [name] is a 60 y.o.-year-old male who sustained a left ankle fracture; he was indicated for open reduction and internal fixation due to the displaced nature of the articular fracture and came to the operating room today for this procedure. The patient did consent to the procedure after discussion of the risks and benefits.  PREOPERATIVE DIAGNOSIS: left trimalleolar ankle fracture  POSTOPERATIVE DIAGNOSIS: Same.  PROCEDURE: Open treatment of left ankle fracture with internal fixation.; Trimalleolar w/o fixation of posterior malleolus CPT 27822.   SURGEON: Jason P. Aundria Rudogers, M.D.  ASSIST: none.  ANESTHESIA:  general  TOURNIQUET TIME: 77 min.  IV FLUIDS AND URINE: See anesthesia.  ESTIMATED BLOOD LOSS: 20 mL.  IMPLANTS: Synthes 4.0 mm cannulated screws 1/3 tubular plate with 3.5 mm cortical and 4.0 mm cancellous screws  COMPLICATIONS: None.  DESCRIPTION OF PROCEDURE: The patient was brought to the operating room and placed supine on the operating table.  The patient had been signed prior to the procedure and this was documented. The patient had the anesthesia placed by the anesthesiologist.  A nonsterile tourniquet was placed on the upper thigh.  The prep verification and incision time-outs were performed to confirm that this was the correct patient, site, side and location. The patient had an SCD on the opposite lower extremity. The patient did receive antibiotics prior to the incision and was re-dosed during the procedure as needed at indicated intervals.  The patient had the lower extremity prepped and draped in the standard surgical fashion.  The extremity was exsanguinated using gravity and the tourniquet was inflated to 250 mm Hg.   Incision was made over the distal fibula and the fracture was exposed and reduced anatomically with a clamp. A lag screw was placed. I then applied a 1/3 tubular locking plate and secured it proximally and  distally with non-locking screws, with the exception of one proximal screw that required a locking screw due to poor bone. Bone quality was fair. I used c-arm to confirm satisfactory reduction and fixation.   I then turned my attention to the medial malleolus. Incision was made over the medial malleolus and the fracture exposed and held provisionally with a clamp. 2 guidepins were placed for the 4.0 mm cannulated screws and then confirmation of reduction was made with fluoroscopy. I then placed 2  40mm screws which had satisfactory fixation.   The syndesmosis was stressed using live fluoroscopy and found to be stable.   The wounds were irrigated, and closed with vicryl and 2-0 nylon for the skin. The wounds were injected with local anesthetic. Sterile gauze was applied followed by a posterior splint with a U stirrup. The patient was awakened and returned to the PACU in stable and satisfactory condition. There were no complications.   POSTOPERATIVE PLAN: [name] will remain nonweightbearing on this leg for approximately 6 weeks; [name] will return for suture removal in 2 weeks.  He will be immobilized in a short leg splint and then transitioned to a CAM walker at his first follow up appointment.  [name] will receive DVT prophylaxis based on other medications, activity level, and risk ratio of bleeding to thrombosis.  Mark RuedJason P Rogers, MD [facility] [phone] 9:58 AM

## 2016-05-30 NOTE — Brief Op Note (Signed)
05/28/2016 - 05/30/2016  9:42 AM  PATIENT:  Fransisca Connorslarence N Hoyt  60 y.o. male  PRE-OPERATIVE DIAGNOSIS:  ankle fx  POST-OPERATIVE DIAGNOSIS:  ankle fx  PROCEDURE:  Procedure(s): OPEN REDUCTION INTERNAL FIXATION (ORIF) ANKLE FRACTURE (Left)  SURGEON:  Surgeon(s) and Role:    * Yolonda KidaJason Patrick Rogers, MD - Primary  PHYSICIAN ASSISTANT:   ASSISTANTS: none   ANESTHESIA:   general  EBL:  Total I/O In: 1500 [I.V.:1500] Out: 25 [Blood:25]  BLOOD ADMINISTERED:none  DRAINS: none   LOCAL MEDICATIONS USED:  MARCAINE     SPECIMEN:  No Specimen  DISPOSITION OF SPECIMEN:  N/A  COUNTS:  YES  TOURNIQUET:   Total Tourniquet Time Documented: Thigh (Left) - 78 minutes Total: Thigh (Left) - 78 minutes   DICTATION: .Note written in EPIC  PLAN OF CARE: Admit to inpatient   PATIENT DISPOSITION:  PACU - hemodynamically stable.   Delay start of Pharmacological VTE agent (>24hrs) due to surgical blood loss or risk of bleeding: not applicable

## 2016-05-30 NOTE — Anesthesia Postprocedure Evaluation (Signed)
Anesthesia Post Note  Patient: Fransisca ConnorsClarence N Leonhardt  Procedure(s) Performed: Procedure(s) (LRB): OPEN REDUCTION INTERNAL FIXATION (ORIF) ANKLE FRACTURE (Left)  Patient location during evaluation: PACU Anesthesia Type: General Level of consciousness: awake and alert Pain management: pain level controlled Vital Signs Assessment: post-procedure vital signs reviewed and stable Respiratory status: spontaneous breathing, nonlabored ventilation, respiratory function stable and patient connected to nasal cannula oxygen Cardiovascular status: blood pressure returned to baseline and stable Postop Assessment: no signs of nausea or vomiting Anesthetic complications: no    Last Vitals:  Vitals:   05/30/16 0947 05/30/16 1002  BP: (!) 146/85 126/82  Pulse: (!) 116 (!) 103  Resp: 20 14  Temp: 36.6 C     Last Pain:  Vitals:   05/30/16 0521  TempSrc:   PainSc: 2                  Crista Nuon,JAMES TERRILL

## 2016-05-30 NOTE — Progress Notes (Signed)
Patient is off the floor to surgery.

## 2016-05-30 NOTE — Anesthesia Procedure Notes (Signed)
Procedure Name: Intubation Date/Time: 05/30/2016 8:49 AM Performed by: Lovie CholOCK, Aveya Beal K Pre-anesthesia Checklist: Patient identified, Emergency Drugs available, Suction available and Patient being monitored Patient Re-evaluated:Patient Re-evaluated prior to inductionOxygen Delivery Method: Circle System Utilized Preoxygenation: Pre-oxygenation with 100% oxygen Intubation Type: IV induction Ventilation: Mask ventilation without difficulty Laryngoscope Size: Miller and 3 Grade View: Grade I Tube type: Oral Tube size: 7.5 mm Number of attempts: 1 Airway Equipment and Method: Stylet Placement Confirmation: ETT inserted through vocal cords under direct vision,  positive ETCO2 and breath sounds checked- equal and bilateral Secured at: 22 cm Tube secured with: Tape Dental Injury: Teeth and Oropharynx as per pre-operative assessment

## 2016-05-30 NOTE — Progress Notes (Signed)
   Subjective: Patient seen and examined after his surgery.  Still coming out of anesthesia but does not have any acute complaints.  Reports regular alcohol use.   Objective:  Vital signs in last 24 hours: Vitals:   05/29/16 1953 05/29/16 2024 05/29/16 2113 05/30/16 0507  BP: 116/63  120/72 (!) 141/83  Pulse: 90  84 90  Resp:      Temp: 98.7 F (37.1 C)   98.5 F (36.9 C)  TempSrc: Oral   Oral  SpO2: 96% 97%  96%  Weight:      Height:       Physical Exam Constitutional: No distress, still waking up from anesthesia Pulmonary/Chest: normal effort Extremities: Left leg splinted and wrapped post-operatively, sensation intact, able to wiggle his toes, warm and perfused Neurological: A&Ox3, CN II - XII grossly intact.  Psychiatric: Normal mood and affect  Assessment/Plan:  Left Trimalleolar Fracture: After mechanical fall prior to admission. [facility] for [facility]. Operative repair this morning by Dr. Aundria Rudogers.  -- Ortho consult, appreciate recommendations  -- Resume regular diet -- Percocet 5mg  q4hrs PRN, monitor pain control  -- Zofran 4mg  PRN -- Remain nonweightbearing on leg for approximately 6 weeks -- Return for suture removal in 2 weeks.  He will be immobilized in a short leg splint and then transitioned to a CAM walker at his first follow up appointment.  EtOH Use: - CIWA protocol  Osteoarthritis of Left knee: Patient has chronic left knee pain as a result of pervious injuries.  Plain films of the left knee on 04/02/16 showed Large chronic osseous defect of the lateral tibial plateau at the site of previous fracture.  He was told by prior doctors to avoid NSAIDs because of his PUD.   -- Voltaren gel   Hypertension:  -- Continue home Lisinopril 10 mg and propanolol 10 mg BID  Hypothyroidism: TSH elevated 27.5 this admission, unclear significant in acute setting.  -- Synthroid dose increased from 125  -> 150 mcg daily  -- Recheck TSH 4-6 weeks  COPD: Patient was on albuterol as needed while incarcerated.  Prior to leaving the prison he was supposed to start Anoro but has not obtained it. No wheezing on exam and patient is stating well on room air.   -- Continue albuterol nebs q6 hrs PRN -- Continue Dulera BID  GERD: Patient is on omeprazole 20mg  daily. Patient reports having EGD's performed while in prison significant for gastric ulcers, likely due to NSAID use.   -- Protonix 40 mg daily   Hepatitis C: History of IVDU and cirrhosis.  Was treated with Harvoni.  LFTs are normal. -- HIV antibodies non-reactive -- Hep C viral load pending to confirm cure  Bipolar Disorder: -- Continue home Seroquel   FEN: No fluids, replete lytes prn, regular diet VTE ppx: Lovenox x 4 weeks post op Code Status: FULL    Dispo: Anticipated discharge pending surgery and ortho recommendations.   Gwynn BurlyAndrew Shahir Karen, DO 05/30/2016, 7:28 AM Pager: [phone]

## 2016-05-30 NOTE — Anesthesia Preprocedure Evaluation (Addendum)
Anesthesia Evaluation  Patient identified by MRN, date of birth, ID band Patient awake    Reviewed: Allergy & Precautions, NPO status , Patient's Chart, lab work & pertinent test results  History of Anesthesia Complications (+) history of anesthetic complications  Airway Mallampati: III  TM Distance: >3 FB Neck ROM: Full    Dental  (+) Edentulous Upper, Dental Advisory Given   Pulmonary COPD, Current Smoker,    breath sounds clear to auscultation       Cardiovascular hypertension, negative cardio ROS   Rhythm:Regular Rate:Normal     Neuro/Psych Anxiety Bipolar Disorder    GI/Hepatic PUD, GERD  ,(+) Hepatitis -, C  Endo/Other  Hypothyroidism   Renal/GU      Musculoskeletal  (+) Arthritis ,   Abdominal   Peds  Hematology   Anesthesia Other Findings   Reproductive/Obstetrics                          Anesthesia Physical Anesthesia Plan  ASA: III  Anesthesia Plan: General   Post-op Pain Management:    Induction: Intravenous  Airway Management Planned: Oral ETT  Additional Equipment:   Intra-op Plan:   Post-operative Plan: Extubation in OR  Informed Consent: I have reviewed the patients History and Physical, chart, labs and discussed the procedure including the risks, benefits and alternatives for the proposed anesthesia with the patient or authorized representative who has indicated his/her understanding and acceptance.   Dental advisory given  Plan Discussed with: CRNA  Anesthesia Plan Comments:         Anesthesia Quick Evaluation

## 2016-05-30 NOTE — Progress Notes (Signed)
Patient refusing to wear continues pulse ox, tried explaining to patient why he needs to wear it, patient kept saying he doesn't need all this stuff hooked up to him, patient also found to be taking off ace wrap from his leg, instructed patient that the wrap needs to stay on, he keeps questioning why the "boot" has to be so heavy, Tried to explain to patient why the splint is on, patient multiple attempts to get out of bed saying he needs to get some fresh air. Explained to patient that he can't go outside after just having surgery and the medications he has had here.

## 2016-05-31 ENCOUNTER — Other Ambulatory Visit: Payer: Self-pay | Admitting: Pharmacist

## 2016-05-31 DIAGNOSIS — Z79899 Other long term (current) drug therapy: Secondary | ICD-10-CM

## 2016-05-31 DIAGNOSIS — B182 Chronic viral hepatitis C: Secondary | ICD-10-CM

## 2016-05-31 DIAGNOSIS — F319 Bipolar disorder, unspecified: Secondary | ICD-10-CM

## 2016-05-31 DIAGNOSIS — M1712 Unilateral primary osteoarthritis, left knee: Secondary | ICD-10-CM

## 2016-05-31 DIAGNOSIS — E876 Hypokalemia: Secondary | ICD-10-CM

## 2016-05-31 DIAGNOSIS — E039 Hypothyroidism, unspecified: Secondary | ICD-10-CM

## 2016-05-31 DIAGNOSIS — J449 Chronic obstructive pulmonary disease, unspecified: Secondary | ICD-10-CM

## 2016-05-31 DIAGNOSIS — W19XXXD Unspecified fall, subsequent encounter: Secondary | ICD-10-CM

## 2016-05-31 DIAGNOSIS — F1010 Alcohol abuse, uncomplicated: Secondary | ICD-10-CM

## 2016-05-31 DIAGNOSIS — K219 Gastro-esophageal reflux disease without esophagitis: Secondary | ICD-10-CM

## 2016-05-31 DIAGNOSIS — Z590 Homelessness: Secondary | ICD-10-CM

## 2016-05-31 DIAGNOSIS — I10 Essential (primary) hypertension: Secondary | ICD-10-CM

## 2016-05-31 DIAGNOSIS — S82852D Displaced trimalleolar fracture of left lower leg, subsequent encounter for closed fracture with routine healing: Secondary | ICD-10-CM

## 2016-05-31 LAB — CBC
HEMATOCRIT: 34.7 % — AB (ref 39.0–52.0)
HEMOGLOBIN: 11.5 g/dL — AB (ref 13.0–17.0)
MCH: 29.8 pg (ref 26.0–34.0)
MCHC: 33.1 g/dL (ref 30.0–36.0)
MCV: 89.9 fL (ref 78.0–100.0)
Platelets: 118 10*3/uL — ABNORMAL LOW (ref 150–400)
RBC: 3.86 MIL/uL — ABNORMAL LOW (ref 4.22–5.81)
RDW: 13.8 % (ref 11.5–15.5)
WBC: 7.2 10*3/uL (ref 4.0–10.5)

## 2016-05-31 LAB — MAGNESIUM: Magnesium: 1.8 mg/dL (ref 1.7–2.4)

## 2016-05-31 LAB — BASIC METABOLIC PANEL
ANION GAP: 8 (ref 5–15)
Anion gap: 6 (ref 5–15)
BUN: <5 mg/dL — ABNORMAL LOW (ref 6–20)
CALCIUM: 8.5 mg/dL — AB (ref 8.9–10.3)
CHLORIDE: 100 mmol/L — AB (ref 101–111)
CHLORIDE: 105 mmol/L (ref 101–111)
CO2: 24 mmol/L (ref 22–32)
CO2: 24 mmol/L (ref 22–32)
CREATININE: 0.85 mg/dL (ref 0.61–1.24)
Calcium: 8.6 mg/dL — ABNORMAL LOW (ref 8.9–10.3)
Creatinine, Ser: 0.91 mg/dL (ref 0.61–1.24)
GFR calc non Af Amer: >60 mL/min (ref >60)
GFR calc non Af Amer: >60 mL/min (ref >60)
Glucose, Bld: 144 mg/dL — ABNORMAL HIGH (ref 65–99)
Glucose, Bld: 127 mg/dL — ABNORMAL HIGH (ref 65–99)
POTASSIUM: 2.8 mmol/L — AB (ref 3.5–5.1)
Potassium: 3.4 mmol/L — ABNORMAL LOW (ref 3.5–5.1)
SODIUM: 135 mmol/L (ref 135–145)
Sodium: 132 mmol/L — ABNORMAL LOW (ref 135–145)

## 2016-05-31 MED ORDER — TIOTROPIUM BROMIDE-OLODATEROL 2.5-2.5 MCG/ACT IN AERS: 5.0000 ug | INHALATION_SPRAY | Freq: Every day | RESPIRATORY_TRACT | 0 refills | Status: DC

## 2016-05-31 MED ORDER — POTASSIUM CHLORIDE CRYS ER 20 MEQ PO TBCR
40.0000 meq | EXTENDED_RELEASE_TABLET | Freq: Once | ORAL | Status: AC
  Administered 2016-05-31: 40 meq via ORAL
  Filled 2016-05-31: qty 2

## 2016-05-31 MED ORDER — HYDROMORPHONE HCL 2 MG/ML IJ SOLN: 0.5000 mg | INTRAMUSCULAR | Status: DC | PRN

## 2016-05-31 MED ORDER — POTASSIUM PHOSPHATES 15 MMOLE/5ML IV SOLN
40.0000 meq | Freq: Once | INTRAVENOUS | Status: AC
  Administered 2016-05-31: 40 meq via INTRAVENOUS
  Filled 2016-05-31: qty 9.09

## 2016-05-31 NOTE — Evaluation (Signed)
Physical Therapy Evaluation Patient Details Name: Mark Marshall MRN: [id number] DOB: 30-Apr-1956 Today's Date: 05/31/2016   History of Present Illness  Pt is a 60 y.o.-year-old malewho sustained a leftanklefracture following a fall and is now s/p ORIF of Lt ankle fx. PMH: HTN, COPD, Hep C, bipolar disorder, anxiety, Lt hip surgery.   Clinical Impression  Pt initially refusing to participate with PT but with encouragement pt willing to stand at EOB. Bed mobility and transfers currently at min guard level. Pt refusing to attempt ambulation today. Regarding D/C, the pt states that he is homeless and he is uncertain of where he will be able to go following his hospital stay. Pt may need SNF for further rehabilitation following his acute stay to progress his mobility. PT to continue to follow and attempt to progress mobility as tolerated.     Follow Up Recommendations SNF;Supervision for mobility/OOB    Equipment Recommendations  Rolling walker with 5" wheels    Recommendations for Other Services       Precautions / Restrictions Precautions Precautions: Fall Restrictions Weight Bearing Restrictions: Yes LLE Weight Bearing: Non weight bearing      Mobility  Bed Mobility Overal bed mobility: Needs Assistance Bed Mobility: Supine to Sit;Sit to Supine     Supine to sit: Supervision;HOB elevated Sit to supine: Min guard   General bed mobility comments: Pt using rail to assist  Transfers Overall transfer level: Needs assistance Equipment used: Rolling walker (2 wheeled) Transfers: Sit to/from Stand Sit to Stand: Min guard         General transfer comment: cues for hand position, reminder for NWB through LLE.   Ambulation/Gait             General Gait Details: Pt unwilling to attempt, reports having too much pain to try.   Stairs            Wheelchair Mobility    Modified Rankin (Stroke Patients Only)       Balance Overall balance assessment: Needs  assistance Sitting-balance support: No upper extremity supported Sitting balance-Leahy Scale: Good     Standing balance support: Bilateral upper extremity supported Standing balance-Leahy Scale: Poor Standing balance comment: using rw                             Pertinent Vitals/Pain Pain Assessment: Faces Faces Pain Scale: Hurts a little bit (reports "not too bad") Pain Location: LLE Pain Descriptors / Indicators: Grimacing;Guarding Pain Intervention(s): Limited activity within patient's tolerance;Monitored during session    Home Living Family/patient expects to be discharged to:: Unsure                 Additional Comments: Pt states that he is homeless. He does have a sister but he states that he cannot stay with her.     Prior Function Level of Independence: Independent               Hand Dominance        Extremity/Trunk Assessment   Upper Extremity Assessment: Overall WFL for tasks assessed           Lower Extremity Assessment: LLE deficits/detail   LLE Deficits / Details: able to raise and move LLE without assist     Communication   Communication: No difficulties  Cognition Arousal/Alertness: Awake/alert Behavior During Therapy: WFL for tasks assessed/performed Overall Cognitive Status: Within Functional Limits for tasks assessed  General Comments General comments (skin integrity, edema, etc.): Pt initially unwilling to participate with PT but with encouragement pt willing to stand at EOB. Pt refusing to attempt ambulation.     Exercises     Assessment/Plan    PT Assessment Patient needs continued PT services  PT Problem List Decreased strength;Decreased range of motion;Decreased activity tolerance;Decreased balance;Decreased mobility          PT Treatment Interventions DME instruction;Gait training;Stair training;Functional mobility training;Therapeutic activities;Therapeutic  exercise;Patient/family education    PT Goals (Current goals can be found in the Care Plan section)  Acute Rehab PT Goals Patient Stated Goal: find somewhere to go and get back to walking PT Goal Formulation: With patient Time For Goal Achievement: 06/14/16 Potential to Achieve Goals: Good    Frequency Min 3X/week   Barriers to discharge        Co-evaluation               End of Session Equipment Utilized During Treatment: Gait belt Activity Tolerance: Patient tolerated treatment well Patient left: in bed;with call bell/phone within reach;with bed alarm set Nurse Communication: Mobility status         Time: 1015-1030 PT Time Calculation (min) (ACUTE ONLY): 15 min   Charges:   PT Evaluation $PT Eval Moderate Complexity: 1 Procedure     PT G Codes:        Christiane HaBenjamin J. Mikolaj Woolstenhulme, PT, CSCS Pager [phone] Office [phone]  05/31/2016, 12:39 PM

## 2016-05-31 NOTE — Progress Notes (Signed)
Medication Samples have been provided to the patient.  Drug: tiotropium (Spiriva) Strength: 1.25 mcg Qty: 1 LOT: [id number]: [id number] a Exp.Date: 11/18 Dosing instructions: 4 puffs daily  The patient has been instructed regarding the correct time, dose, and frequency of taking this medication, including desired effects and most common side effects.   Kim,Jennifer J 4:47 PM 05/31/2016

## 2016-05-31 NOTE — Progress Notes (Signed)
   Subjective:  Patient reports pain as moderate.  Asking for more pain medication  Objective:   VITALS:   Vitals:   05/30/16 2026 05/30/16 2054 05/31/16 0552 05/31/16 0700  BP:  (!) 160/95 135/75   Pulse:  (!) 109 95 95  Resp:  18 18 18   Temp:  98.5 F (36.9 C) 99.5 F (37.5 C)   TempSrc:  Oral Oral   SpO2: 99% 96% 95% 96%  Weight:      Height:        Neurologically intact Neurovascular intact Sensation intact distally splint in place and intact Wiggles toes  Lab Results  Component Value Date   WBC 7.2 05/31/2016   HGB 11.5 (L) 05/31/2016   HCT 34.7 (L) 05/31/2016   MCV 89.9 05/31/2016   PLT 118 (L) 05/31/2016   BMET    Component Value Date/Time   NA 132 (L) 05/31/2016 0526   K 2.8 (L) 05/31/2016 0526   CL 100 (L) 05/31/2016 0526   CO2 24 05/31/2016 0526   GLUCOSE 144 (H) 05/31/2016 0526   BUN <5 (L) 05/31/2016 0526   CREATININE 0.91 05/31/2016 0526   CALCIUM 8.6 (L) 05/31/2016 0526   GFRNONAA >60 05/31/2016 0526   GFRAA >60 05/31/2016 0526     Assessment/Plan: 1 Day Post-Op   Principal Problem:   Trimalleolar fracture of ankle, closed, left, initial encounter Active Problems:   Osteoarthritis of left knee   Bipolar disorder (HCC)   Essential hypertension   Hypothyroidism   Peptic ulcer disease   COPD (chronic obstructive pulmonary disease) (HCC)   Tobacco use disorder   Hepatitis C   Hepatic cirrhosis (HCC)   Advance diet Up with therapy NWB LLE for 6 weeks, maintain splint Will need follow up with Aundria Rudogers in 2 weeks for wound check Would recommend lovenox for dvt ppx in house and once daily asa 325 mg for 6 weeks total once dc'd home In regards to his knee, he has a chronic injury that needs bracing once he returns to weight bearing.   Yolonda KidaJason Patrick Katti Pelle 05/31/2016, 1:11 PM   Maryan RuedJason P Mariam Helbert, MD [phone]

## 2016-05-31 NOTE — Progress Notes (Signed)
   Subjective: Patient complaining of pain uncontrolled with PRNs. Requesting his daily medications this morning, upset that he has not received them yet.   Objective:  Vital signs in last 24 hours: Vitals:   05/30/16 2026 05/30/16 2054 05/31/16 0552 05/31/16 0700  BP:  (!) 160/95 135/75   Pulse:  (!) 109 95 95  Resp:  18 18 18   Temp:  98.5 F (36.9 C) 99.5 F (37.5 C)   TempSrc:  Oral Oral   SpO2: 99% 96% 95% 96%  Weight:      Height:       Physical Exam Constitutional: NAD, eating breakafast  Pulmonary/Chest: normal effort, CTAB Cardiovascular: RRR Extremities: Left leg splinted and wrapped post-operatively, sensation intact, able to wiggle his toes, warm and perfused Neurological: A&Ox3, CN II - XII grossly intact.  Psychiatric: Normal mood and affect  Assessment/Plan:  Left Trimalleolar Fracture: After mechanical fall prior to admission. [facility] and [facility] was [facility] here for admission. Operative repair yesterday per Dr. Aundria Rudogers. Patient is homeless and will likely need placement for rehab pending PT recs.  -- Ortho following, appreciate recommendations  -- Resume regular diet -- Oxy 5mg  q4hrs PRN -- Will add IV dilaudid 0.5 mg q4hrs today for breakthrough pain  -- Zofran 4mg  PRN -- Remain nonweightbearing on leg for approximately 6 weeks -- Follow up with ortho in 2 weeks for suture removal.He will be immobilized in a short leg splint and then transitioned to a CAM walker at his first follow up appointment -- PT evaluation today  -- Case management consult for dispo   Hypokalemia: Potassium down 2.8 today from 3.4 yesterday. Magnesium 1.8 today (wnl).   -- Will replete with IV 40 mEq + PO 40 mEq today  -- Recheck BMET in AM   EtOH Use: -- CIWA protocol  Osteoarthritis of Left knee: Patient has chronic left knee pain as a result of pervious injuries. Plain films of the left knee on 04/02/16 showed Large chronic  osseous defect of the lateral tibial plateau at the site of previous fracture. He was told by prior doctors to avoid NSAIDs because of his PUD.  -- Voltaren gel   Hypertension:  -- Continue home Lisinopril 10 mg and propanolol 10 mg BID  Hypothyroidism: TSH elevated 27.5 this admission, unclear significant in acute setting.  -- Synthroid dose increased from 125 -> 150 mcg daily  -- Recheck TSH 4-6 weeks  COPD: Patient was on albuterol as needed while incarcerated. Prior to leaving the prison he was supposed to start Anoro but has not obtained it. No wheezing on exam and patient is stating well on room air.  -- Continue albuterol nebs q6 hrs PRN -- Continue Dulera BID  GERD: Patient is on omeprazole 20mg  daily. Patient reports having EGD's performed while in prison significant for gastric ulcers, likely due to NSAID use.  -- Protonix 40 mg daily   Hepatitis C: History of IVDU and cirrhosis. Was treated with Harvoni. LFTs are normal. -- HIV antibodies non-reactive -- Hep C viral load pending to confirm cure  Bipolar Disorder: -- Continue home Seroquel   FEN: No fluids, replete lytes prn, regular diet VTE ppx: Lovenox x 4 weeks post op Code Status: FULL    Dispo: Anticipated discharge in approximately 1-2 days pending PT recommendations and case management consult for dispo.   Mark Pollarolyn Labrenda Lasky, MD 05/31/2016, 9:40 AM Pager: [phone]

## 2016-06-01 DIAGNOSIS — K5900 Constipation, unspecified: Secondary | ICD-10-CM

## 2016-06-01 LAB — BASIC METABOLIC PANEL
ANION GAP: 8 (ref 5–15)
BUN: 5 mg/dL — ABNORMAL LOW (ref 6–20)
CALCIUM: 8.7 mg/dL — AB (ref 8.9–10.3)
CO2: 24 mmol/L (ref 22–32)
Chloride: 103 mmol/L (ref 101–111)
Creatinine, Ser: 0.92 mg/dL (ref 0.61–1.24)
GFR calc Af Amer: >60 mL/min (ref >60)
Glucose, Bld: 118 mg/dL — ABNORMAL HIGH (ref 65–99)
POTASSIUM: 3.6 mmol/L (ref 3.5–5.1)
SODIUM: 135 mmol/L (ref 135–145)

## 2016-06-01 LAB — CBC
HEMATOCRIT: 36.8 % — AB (ref 39.0–52.0)
HEMOGLOBIN: 12 g/dL — AB (ref 13.0–17.0)
MCH: 30.2 pg (ref 26.0–34.0)
MCHC: 32.6 g/dL (ref 30.0–36.0)
MCV: 92.7 fL (ref 78.0–100.0)
Platelets: 138 10*3/uL — ABNORMAL LOW (ref 150–400)
RBC: 3.97 MIL/uL — ABNORMAL LOW (ref 4.22–5.81)
RDW: 14.2 % (ref 11.5–15.5)
WBC: 6 10*3/uL (ref 4.0–10.5)

## 2016-06-01 MED ORDER — OXYCODONE HCL 5 MG PO TABS
5.0000 mg | ORAL_TABLET | ORAL | Status: DC | PRN
  Administered 2016-06-01 – 2016-06-03 (×9): 10 mg via ORAL
  Filled 2016-06-01 (×9): qty 2

## 2016-06-01 MED ORDER — OXYCODONE HCL 5 MG PO TABS
5.0000 mg | ORAL_TABLET | Freq: Once | ORAL | Status: AC
  Administered 2016-06-01: 5 mg via ORAL
  Filled 2016-06-01: qty 1

## 2016-06-01 MED ORDER — SENNOSIDES-DOCUSATE SODIUM 8.6-50 MG PO TABS: 1.0000 | ORAL_TABLET | Freq: Every day | ORAL | Status: DC | PRN

## 2016-06-01 NOTE — Progress Notes (Signed)
   Subjective: Patient doing well this morning. Continues to complain of pain. Worked with PT yesterday and says it went well. Inquiring about discharge, requesting that social work call his sister Clydie BraunKaren.   Objective:  Vital signs in last 24 hours: Vitals:   05/31/16 2003 05/31/16 2035 05/31/16 2049 06/01/16 0544  BP: 119/68  119/68 122/72  Pulse: 73  73 70  Resp: 18   18  Temp: 98.5 F (36.9 C)   98 F (36.7 C)  TempSrc: Oral   Oral  SpO2: 97% 95%  96%  Weight:      Height:       Physical Exam Constitutional: NAD, eating breakafast  Pulmonary/Chest:normal effort, CTAB Cardiovascular: RRR Extremities: Left leg splinted and wrapped post-operatively, sensation intact, able to wiggle his toes, warm and perfused Neurological:A&Ox3, CN II - XII grossly intact.  Psychiatric:Normal mood and affect  Assessment/Plan:  Left Trimalleolar Fracture: After mechanical fall prior to admission. [facility] and patient was transferred here for admission. Operative repair 05/30/2016 per Dr. Aundria Rudogers. -- Ortho following, appreciate recommendations  -- Remain nonweightbearing on affected leg for 6 weeks -- Follow up with ortho in 2 weeks for suture removal.He will be immobilized in a short leg splint and then transitioned to a CAM walker at his first follow up appointment -- Resume regular diet -- Oxy 5-10 mg q4hrs PRN -- Zofran 4mg  PRN -- PT evaluation recommending SNF -- Social work consult for dispo   Constipation: No BM since surgery. Endorsing generalized abdominal pain this morning.  -- Senna-docusate daily prn -- Will add miralax if needed  Hypokalemia: Resolved (2.8 -> 3.6) -- S/p IV 40 mEq + PO 40 mEq yesterday   EtOH Use: -- CIWA protocol  Osteoarthritis of Left knee: Patient has chronic left knee pain as a result of pervious injuries. Plain films of the left knee on 04/02/16 showed Large chronic osseous defect of the lateral tibial  plateau at the site of previous fracture. He was told by prior doctors to avoid NSAIDs because of his PUD.  -- Voltaren gel   Hypertension:  -- Continue home Lisinopril 10 mg and propanolol 10 mg BID  Hypothyroidism: TSH elevated 27.5 this admission, unclear significant in acute setting.  -- Synthroid dose increased from 125 ->150 mcg daily  -- Recheck TSH 4-6 weeks  COPD: Patient was on albuterol as needed while incarcerated. Prior to leaving the prison he was supposed to start Anoro but has not obtained it. No wheezing on exam and patient is stating well on room air.  -- Continue albuterol nebs q6 hrs PRN -- Continue Dulera BID  GERD: Patient is on omeprazole 20mg  daily. Patient reports having EGD's performed while in prison significant for gastric ulcers, likely due to NSAID use.  -- Protonix 40 mg daily   Hepatitis C: History of IVDU and cirrhosis. Was treated with Harvoni. LFTs are normal. -- HIV antibodies non-reactive -- Hep C viral load pending to confirm cure  Bipolar Disorder: -- Continue home Seroquel   FEN: No fluids, replete lytes prn, regular diet VTE ppx: Lovenox until discharge then ASA 325 for 6 weeks Code Status: FULL   Dispo: Anticipated discharge pending SNF placement.   Reymundo Pollarolyn Airrion Otting, MD 06/01/2016, 9:06 AM Pager: [phone]

## 2016-06-01 NOTE — Progress Notes (Signed)
Physical Therapy Treatment Patient Details Name: Mark Marshall MRN: [id number] DOB: [birth year] Today's Date: 06/01/2016    History of Present Illness Pt is a 60 y.o.-year-old malewho sustained a leftanklefracture following a fall and is now s/p ORIF of Lt ankle fx. PMH: HTN, COPD, Hep C, bipolar disorder, anxiety, Lt hip surgery.     PT Comments    Patient able to ambulate short distance, but limited by pain and weight of splint on R foot with weakness.  Also high fall risk due to sliding R foot on floor.  Continue to feel STSNF rehab appropriate for safety and strengthening.  Will follow acutely.   Follow Up Recommendations  SNF;Supervision for mobility/OOB     Equipment Recommendations  Crutches    Recommendations for Other Services       Precautions / Restrictions Precautions Precautions: Fall Restrictions Weight Bearing Restrictions: Yes LLE Weight Bearing: Non weight bearing    Mobility  Bed Mobility         Supine to sit: Supervision;HOB elevated     General bed mobility comments: Pt using rail to assist  Transfers Overall transfer level: Needs assistance Equipment used: Crutches   Sit to Stand: Min guard         General transfer comment: cues for hand position, reminder for NWB through LLE.   Ambulation/Gait Ambulation/Gait assistance: Min guard Ambulation Distance (Feet): 30 Feet Assistive device: Crutches Gait Pattern/deviations: Step-to pattern;Shuffle     General Gait Details: slides R foot on floor, some difficulty keeping L foot up due to weakness, pain, but maintains NWB for the most part, reports unable to pick up R foot   Stairs            Wheelchair Mobility    Modified Rankin (Stroke Patients Only)       Balance Overall balance assessment: Needs assistance         Standing balance support: Bilateral upper extremity supported Standing balance-Leahy Scale: Poor Standing balance comment: crutches for balance                     Cognition Arousal/Alertness: Awake/alert Behavior During Therapy: WFL for tasks assessed/performed Overall Cognitive Status: Within Functional Limits for tasks assessed                      Exercises      General Comments        Pertinent Vitals/Pain Faces Pain Scale: Hurts a little bit Pain Location: L LE; worse with dependency Pain Descriptors / Indicators: Throbbing Pain Intervention(s): Monitored during session;Limited activity within patient's tolerance;Repositioned    Home Living                      Prior Function            PT Goals (current goals can now be found in the care plan section) Progress towards PT goals: Progressing toward goals    Frequency    Min 3X/week      PT Plan Current plan remains appropriate    Co-evaluation             End of Session Equipment Utilized During Treatment: Gait belt Activity Tolerance: Patient limited by fatigue Patient left: in chair;with call bell/phone within reach     Time: 5284-1324 PT Time Calculation (min) (ACUTE ONLY): 20 min  Charges:  $Gait Training: 8-22 mins  G CodesElray Mcgregor:      Bensen Chadderdon 06/01/2016, 5:57 PM  Sheran Lawlessyndi Meara Wiechman, PT [phone] 06/01/2016

## 2016-06-01 NOTE — Clinical Social Work Note (Addendum)
11:52am- CSW attempted to update sister as requested by patient.  CSW left message.  CSW awaiting a return call.  11:49am- CSW was advised that patient would need to be reassessed by PT to determine recommendation.  Patient will need to be willing to participate with therapy for an accurate assessment and determination of placement need.  MD updated.  11:10am- CSW following patient for assistance with disposition.  Patient has supportive sister.  PT has recommended SNF though patient has refused to ambulate and work with PT.  Of note, patient is min guard for transfers.  Patient reports being homeless.  Patient admits to hospital with ankle fracture.  Patient does not have a payer source and is unable to private pay for SNF/STR.  At this time, this CSW does not see a skillable need for patient to qualify for SNF/STR.  CSW sought direction from Asst CSW Director.  CSW will update MD once a decision has been made.  Vickii PennaGina Mayuri Staples, MSW, LCSW  [phone]  Licensed Clinical Social Worker

## 2016-06-01 NOTE — Progress Notes (Signed)
   Subjective:  Patient reports pain much improved from yesterday.  Objective:   VITALS:   Vitals:   05/31/16 2035 05/31/16 2049 06/01/16 0544 06/01/16 1332  BP:  119/68 122/72 129/89  Pulse:  73 70 88  Resp:   18 18  Temp:   98 F (36.7 C) 97.7 F (36.5 C)  TempSrc:   Oral Oral  SpO2: 95%  96% 96%  Weight:      Height:        Neurologically intact Neurovascular intact Sensation intact distally splint in place and intact Wiggles toes No pain with passive stretch  Lab Results  Component Value Date   WBC 6.0 06/01/2016   HGB 12.0 (L) 06/01/2016   HCT 36.8 (L) 06/01/2016   MCV 92.7 06/01/2016   PLT 138 (L) 06/01/2016   BMET    Component Value Date/Time   NA 135 06/01/2016 0808   K 3.6 06/01/2016 0808   CL 103 06/01/2016 0808   CO2 24 06/01/2016 0808   GLUCOSE 118 (H) 06/01/2016 0808   BUN 5 (L) 06/01/2016 0808   CREATININE 0.92 06/01/2016 0808   CALCIUM 8.7 (L) 06/01/2016 0808   GFRNONAA >60 06/01/2016 0808   GFRAA >60 06/01/2016 0808     Assessment/Plan: 2 Days Post-Op   Principal Problem:   Trimalleolar fracture of ankle, closed, left, initial encounter Active Problems:   Osteoarthritis of left knee   Bipolar disorder (HCC)   Essential hypertension   Hypothyroidism   Peptic ulcer disease   COPD (chronic obstructive pulmonary disease) (HCC)   Tobacco use disorder   Hepatitis C   Hepatic cirrhosis (HCC)   Advance diet Up with therapy NWB LLE for 6 weeks, maintain splint Will need follow up with Aundria Rudogers in 2 weeks for wound check Would recommend lovenox for dvt ppx in house and once daily asa 325 mg for 6 weeks total once dc'd home In regards to his knee, he has a chronic injury that needs bracing once he returns to weight bearing  Call with questions.   Yolonda KidaJason Patrick Rogers 06/01/2016, 5:01 PM   Maryan RuedJason P Rogers, MD [phone]

## 2016-06-02 ENCOUNTER — Encounter (HOSPITAL_COMMUNITY): Payer: Self-pay | Admitting: Orthopedic Surgery

## 2016-06-02 DIAGNOSIS — Z419 Encounter for procedure for purposes other than remedying health state, unspecified: Secondary | ICD-10-CM

## 2016-06-02 LAB — BASIC METABOLIC PANEL
ANION GAP: 7 (ref 5–15)
BUN: 5 mg/dL — ABNORMAL LOW (ref 6–20)
CHLORIDE: 103 mmol/L (ref 101–111)
CO2: 25 mmol/L (ref 22–32)
Calcium: 8.6 mg/dL — ABNORMAL LOW (ref 8.9–10.3)
Creatinine, Ser: 0.86 mg/dL (ref 0.61–1.24)
GFR calc non Af Amer: >60 mL/min (ref >60)
Glucose, Bld: 114 mg/dL — ABNORMAL HIGH (ref 65–99)
POTASSIUM: 3.5 mmol/L (ref 3.5–5.1)
Sodium: 135 mmol/L (ref 135–145)

## 2016-06-02 NOTE — Clinical Social Work Note (Signed)
CSW received consult for SNF placement.  Of note, patient states he is homeless and presents without a payer source.  At this time, patient does not qualify for a letter of guarantee and does not have means to pay for SNF out of pocket.  CSW met with patient and sister.  Sister states she has somewhere for the patient to stay upon dc, but she needs to "clean it out".  Patient is agreeable to the plan he and his sister have made.  Patient is aware of his follow-up appts and sister is agreeable to assist with transportation to these appointments.    RNCM was consulted.  Sister wishes to pick up patient tomorrow 10/26 at [place] signing off.  MD paged and made aware.  Nonnie Done, MSW, LCSW  [phone]  Licensed Clinical Social Worker

## 2016-06-02 NOTE — Progress Notes (Signed)
   Subjective: Patient is doing well this morning. Continues to complain of pain but is getting relief with PRN oxy. Patient had a BM yesterday and is tolerating PO well.   Objective:  Vital signs in last 24 hours: Vitals:   05/31/16 2049 06/01/16 0544 06/01/16 1332 06/01/16 1950  BP: 119/68 122/72 129/89 106/69  Pulse: 73 70 88 85  Resp:  18 18 18   Temp:  98 F (36.7 C) 97.7 F (36.5 C) 98 F (36.7 C)  TempSrc:  Oral Oral Oral  SpO2:  96% 96% 98%  Weight:      Height:       Physical Exam Constitutional: NAD, eating breakafast  Pulmonary/Chest:normal effort, CTAB Cardiovascular: RRR Extremities: Left leg splinted and wrapped, sensation intact, able to wiggle his toes, warm and perfused Neurological:A&Ox3, CN II - XII grossly intact.  Psychiatric:Normal mood and affect  Assessment/Plan:  Left Trimalleolar Fracture:After mechanical fall prior to admission. [facility] and patient was transferred here for admission. Operative repair 05/30/2016 per Dr. Aundria Rudogers. -- Ortho following, appreciate recommendations  -- Remain nonweightbearing on affected leg for 6 weeks -- Follow up with ortho 2 weeks post op for suture removal.He will be immobilized in a short leg splint and then transitioned to a CAM walker at his first follow up appointment -- Resume regular diet -- Oxy 5-10 mg q4hrs PRN -- PT evaluation recommending SNF -- Social work consult for dispo   Constipation: Resolved -- Senna-docusate prn  Hypokalemia: Resolved -- Daily BMET  EtOH Use: -- CIWA protocol  Osteoarthritis of Left knee: Patient has chronic left knee pain as a result of pervious injuries. Plain films of the left knee on 04/02/16 showed Large chronic osseous defect of the lateral tibial plateau at the site of previous fracture. He was told by prior doctors to avoid NSAIDs because of his PUD.  -- Voltaren gel   Hypertension:  -- Continue home Lisinopril 10  mg and propanolol 10 mg BID  Hypothyroidism: TSH elevated 27.5 this admission, unclear significant in acute setting.  -- Synthroid dose increased from 125 ->150 mcg daily  -- Recheck TSH 4-6 weeks  COPD: Patient was on albuterol as needed while incarcerated. Prior to leaving the prison he was supposed to start Anoro but has not obtained it. No wheezing on exam and patient is stating well on room air.  -- Continue albuterol nebs q6 hrs PRN -- Continue Dulera BID  GERD: Patient is on omeprazole 20mg  daily. Patient reports having EGD's performed while in prison significant for gastric ulcers, likely due to NSAID use.  -- Protonix 40 mg daily   Hepatitis C: History of IVDU and cirrhosis. Was treated with Harvoni. LFTs are normal. -- HIV antibodies non-reactive -- Hep C viral load undetectable   Bipolar Disorder: -- Continue home Seroquel   FEN: No fluids, replete lytes prn, regular diet VTE ppx: Lovenox until discharge then ASA 325 for 6 weeks Code Status: FULL   Dispo: Anticipated discharge pending SNF placement and social work recommendations.   Reymundo Pollarolyn Ortha Metts, MD 06/02/2016, 9:41 AM Pager: [phone]

## 2016-06-02 NOTE — Care Management Note (Signed)
Case Management Note  Patient Details  Name: Fransisca ConnorsClarence N Mcbain MRN: [id number] Date of Birth: 10/22/1955  Subjective/Objective:    Left Trimalleolar Fracture,  HTN         Action/Plan: Discharge Planning:  Pt states he is currently homeless. States his sister has arranged a place for him to stay. CSW referral for SNF placment, pt does not have insurance or payor for SNF. Pt has crutches in room. Will need MATCH for assistance with medications. Waiting final recommendations for home. Pt states he cannot afford to pay for HHPT out of pocket.     Expected Discharge Date:                Expected Discharge Plan:  Home/Self Care  In-House Referral:  Clinical Social Work  Discharge planning Services  CM Consult  Post Acute Care Choice:  NA Choice offered to:  NA  DME Arranged:  Crutches DME Agency:  Other - Comment  HH Arranged:  NA HH Agency:  NA  Status of Service:  In process, will continue to follow  If discussed at Long Length of Stay Meetings, dates discussed:    Additional Comments:  Elliot CousinShavis, Chauntae Hults Ellen, RN 06/02/2016, 4:05 PM

## 2016-06-03 DIAGNOSIS — G8911 Acute pain due to trauma: Secondary | ICD-10-CM

## 2016-06-03 LAB — BASIC METABOLIC PANEL
Anion gap: 6 (ref 5–15)
BUN: 6 mg/dL (ref 6–20)
CALCIUM: 8.8 mg/dL — AB (ref 8.9–10.3)
CO2: 23 mmol/L (ref 22–32)
Chloride: 106 mmol/L (ref 101–111)
Creatinine, Ser: 0.89 mg/dL (ref 0.61–1.24)
GFR calc Af Amer: >60 mL/min (ref >60)
GLUCOSE: 109 mg/dL — AB (ref 65–99)
Potassium: 4 mmol/L (ref 3.5–5.1)
Sodium: 135 mmol/L (ref 135–145)

## 2016-06-03 MED ORDER — DICLOFENAC SODIUM 1 % TD GEL: 4.0000 g | Freq: Four times a day (QID) | TRANSDERMAL | 2 refills | Status: DC

## 2016-06-03 MED ORDER — ALBUTEROL SULFATE HFA 108 (90 BASE) MCG/ACT IN AERS: 2.0000 | INHALATION_SPRAY | Freq: Four times a day (QID) | RESPIRATORY_TRACT | 3 refills | Status: DC | PRN

## 2016-06-03 MED ORDER — MOMETASONE FURO-FORMOTEROL FUM 100-5 MCG/ACT IN AERO: 2.0000 | INHALATION_SPRAY | Freq: Two times a day (BID) | RESPIRATORY_TRACT | 2 refills | Status: DC

## 2016-06-03 MED ORDER — OXYCODONE HCL 5 MG PO TABS: 5.0000 mg | ORAL_TABLET | Freq: Four times a day (QID) | ORAL | 0 refills | Status: DC | PRN

## 2016-06-03 MED ORDER — IBUPROFEN 200 MG PO TABS
600.0000 mg | ORAL_TABLET | Freq: Four times a day (QID) | ORAL | Status: DC
  Administered 2016-06-03: 600 mg via ORAL
  Filled 2016-06-03: qty 3

## 2016-06-03 MED ORDER — OXYCODONE HCL 5 MG PO TABS
5.0000 mg | ORAL_TABLET | ORAL | Status: DC | PRN
  Administered 2016-06-03 (×2): 5 mg via ORAL
  Filled 2016-06-03 (×2): qty 1

## 2016-06-03 NOTE — Progress Notes (Signed)
Discharge instructions and prescription given to patient. Patient stated understanding. IV removed. Patient is waiting for his sister for transportation Chantell Kunkler A Psychologist, counsellingColter, RCharity fundraiser

## 2016-06-03 NOTE — Progress Notes (Signed)
   Subjective: Asked patient this morning about going home with his sister today and patient replied, "over my dead body". Sister has made arrangements for patient to go to a homeless shelter on discharge.   Objective:  Vital signs in last 24 hours: Vitals:   06/02/16 1504 06/02/16 2052 06/03/16 0450 06/03/16 0759  BP: 117/68 127/68 129/86   Pulse: 80 82 86   Resp: 18 18 20    Temp: 98 F (36.7 C) 98 F (36.7 C) 98.2 F (36.8 C)   TempSrc: Oral Oral Oral   SpO2: 98% 97% 99% 98%  Weight:      Height:       Physical Exam Constitutional: NAD, eating breakafast  Pulmonary/Chest:normal effort, CTAB Cardiovascular: RRR Extremities: Left leg splinted and wrapped, sensation intact, able to wiggle his toes, warm and perfused Neurological:A&Ox3, CN II - XII grossly intact.  Psychiatric:Normal mood and affect  Assessment/Plan:  Left Trimalleolar Fracture:After mechanical fall prior to admission. [facility] and patient was transferred here for admission. Operative repair 05/30/2016 per Dr. Aundria Rudogers. -- Ortho following, appreciate recommendations  -- Remain nonweightbearing on affected legfor 6 weeks -- Follow up with ortho 2 weeks post op for suture removal.He will be immobilized in a short leg splint and then transitioned to a CAM walker at his first follow up appointment -- Resume regular diet -- Oxy 5 mg q4hrs PRN -- PT evaluation recommending SNF but patient is homeless  -- Social work consult for dispo   Constipation: Resolved -- Senna-docusate prn  Hypokalemia: Resolved -- Daily BMET  EtOH Use: -- CIWA protocol  Osteoarthritis of Left knee: Patient has chronic left knee pain as a result of pervious injuries. Plain films of the left knee on 04/02/16 showed Large chronic osseous defect of the lateral tibial plateau at the site of previous fracture. He was told by prior doctors to avoid NSAIDs because of his PUD.  -- Voltaren gel    Hypertension:  -- Continue home Lisinopril 10 mg and propanolol 10 mg BID  Hypothyroidism: TSH elevated 27.5 this admission, unclear significant in acute setting.  -- Synthroid dose increased from 125 ->150 mcg daily  -- Recheck TSH 4-6 weeks  COPD: Patient was on albuterol as needed while incarcerated. Prior to leaving the prison he was supposed to start Anoro but has not obtained it. No wheezing on exam and patient is stating well on room air.  -- Continue albuterol nebs q6 hrs PRN -- Continue Dulera BID  GERD: Patient is on omeprazole 20mg  daily. Patient reports having EGD's performed while in prison significant for gastric ulcers, likely due to NSAID use.  -- Protonix 40 mg daily   Hepatitis C: History of IVDU and cirrhosis. Was treated with Harvoni. LFTs are normal. -- HIV antibodies non-reactive -- Hep C viral load undetectable   Bipolar Disorder: -- Continue home Seroquel   FEN: No fluids, replete lytes prn, regular diet VTE ppx: Lovenox until discharge then ASA 325 for 6 weeks Code Status: FULL   Dispo: Anticipated discharge pending safe discharge plan.   Reymundo Pollarolyn Latiana Tomei, MD 06/03/2016, 9:08 AM Pager: [phone]

## 2016-06-03 NOTE — Discharge Summary (Signed)
Name: Mark ConnorsClarence N Marshall MRN: [id number] DOB: [birth year] 60 y.o. PCP: Alm BustardMatthew O'Sullivan, MD  Date of Admission: 05/28/2016  9:15 AM Date of Discharge: 06/03/2016 Attending Physician: Inez CatalinaEmily B Mullen, MD  Discharge Diagnosis: 1. Trimalleolar fracture of left ankle   Discharge Medications:   Medication List    STOP taking these medications   naproxen sodium 220 MG tablet Commonly known as:  ANAPROX     TAKE these medications   albuterol 108 (90 Base) MCG/ACT inhaler Commonly known as:  PROVENTIL HFA;VENTOLIN HFA Inhale 2 puffs into the lungs every 6 (six) hours as needed for wheezing or shortness of breath.   diclofenac sodium 1 % Gel Commonly known as:  VOLTAREN Apply 4 g topically 4 (four) times daily.   FISH OIL PO Take 1 capsule by mouth 2 (two) times a week.   gabapentin 300 MG capsule Commonly known as:  NEURONTIN Take 300 mg by mouth 3 (three) times daily.   levothyroxine 125 MCG tablet Commonly known as:  SYNTHROID Take 1 tablet (125 mcg total) by mouth daily.   lisinopril 10 MG tablet Commonly known as:  PRINIVIL,ZESTRIL Take 2 tablets (20 mg total) by mouth daily.   mometasone-formoterol 100-5 MCG/ACT Aero Commonly known as:  DULERA Inhale 2 puffs into the lungs 2 (two) times daily.   omeprazole 20 MG capsule Commonly known as:  PRILOSEC Take 1 capsule (20 mg total) by mouth daily.   oxyCODONE 5 MG immediate release tablet Commonly known as:  Oxy IR/ROXICODONE Take 1 tablet (5 mg total) by mouth every 6 (six) hours as needed for severe pain.   prazosin 1 MG capsule Commonly known as:  MINIPRESS Take 1 capsule (1 mg total) by mouth at bedtime.   propranolol 10 MG tablet Commonly known as:  INDERAL Take 1 tablet (10 mg total) by mouth 2 (two) times daily.   QUEtiapine 200 MG tablet Commonly known as:  SEROQUEL Take 1 tablet (200 mg total) by mouth at bedtime. What changed:  Another medication with the same name was removed. Continue taking  this medication, and follow the directions you see here.   umeclidinium-vilanterol 62.5-25 MCG/INH Aepb Commonly known as:  ANORO ELLIPTA Inhale 1 puff into the lungs daily.       Disposition and follow-up:   [name] [facility] St[facility].  At the hospital follow up visit please address:  1. Post operative care: S/p trimalleolar left ankle fracture repair on 05/30/2016. Please ensure patient has ortho follow up scheduled for 2 weeks post op.   2. COPD: Patient was started on Dulera BID this admission in addition to his prn albuterol. Please follow up on symptom management.   3. Hypothyroidism: TSH was elevated this admission and home synthroid was increased to 150 mcg daily. Please recheck TSH in 4-6 weeks.   4.  Labs / imaging needed at time of follow-up: None  5.  Pending labs/ test needing follow-up: None  Follow-up Appointments: Follow-up Information    Yolonda KidaJason Patrick Rogers, MD. Schedule an appointment as soon as possible for a visit in 2 week(s).   Specialty:  Orthopedic Surgery Contact information: [address] [place] [address] [phone]           Hospital Course by problem list:  1. Trimalleolar Fracture of Left Ankle: Patient states that he was walking downhill and the grass was wet and he slipped and fell backwards and heard a snap. In ED  he was found to have an unstable left trimalleolar ankle fracture. Fracture was reduced and splinted at [facility] and patient was transferred to [facility] for admission. Ortho was consulted and he underwent operative repair per Dr. Aundria Rud on 05/30/2016. He was immobilized in a short leg splint and made non weight bearing for 6 weeks. He will need to follow up with ortho at 2 weeks post op for suture removal and will be transition to a CAM walker at that time. PT recommended SNF. However patient is homeless and financially that was not an option for him.  His sister made arrangement for patient to live with his nephew temporarily while he recovers.   2. COPD: Patient was on albuterol as needed while incarcerated. Prior to leaving the prison he was supposed to start Anoro but has not obtained it. Patient denies SOB and was breathing comfortable on RA this admission without wheezing. He was started on Dulera BID in addition to his albuterol and provided with both inhalers on discharge.   3. Hypothyroidism: TSH was elevated this admission to 27.5. Significant is unclear in the acute setting, however patient reported compliance with his home synthroid. Dose was increased from 125 to 150 mcg daily and patient tolerated the adjustment well. Recommend rechecking TSH in 4-6 weeks.   Discharge Vitals:   BP 129/86 (BP Location: Left Arm)   Pulse 86   Temp 98.2 F (36.8 C) (Oral)   Resp 20   Ht 5\' 11"  (1.803 m)   Wt 197 lb 8.5 oz (89.6 kg)   SpO2 98%   BMI 27.55 kg/m   Pertinent Labs, Studies, and Procedures:   05/28/2016 DG Tibia/Fibula Left: IMPRESSION: 1. Displaced fracture of the medial and lateral malleoli. Posterior malleolus fracture cannot be excluded.  2. Deformity noted of the lateral tibial plateau consistent with previously identified depressed lateral tibial plateau fracture.  05/28/2016  CT Ankle Left: IMPRESSION: 1. Weber C stage 4 trimalleolar fracture without dislocation. The configuration is considered unstable. No flexor tendon entrapment. 2. Degenerative chondral thinning in the tibiotalar joint. 3. Plantar calcaneal spur.  05/30/2016 DG C-Arm 1-60 Min: IMPRESSION: Status post ORIF of distal tibial and fibular fractures.  05/30/2016 DG Ankle 2 View Left:   IMPRESSION: Status post ORIF of distal tibial and fibular fractures.  Discharge Instructions: Discharge Instructions    Call MD for:  redness, tenderness, or signs of infection (pain, swelling, redness, odor or green/yellow discharge around incision site)     Complete by:  As directed    Call MD for:  temperature >100.4    Complete by:  As directed    Diet - low sodium heart healthy    Complete by:  As directed    Discharge instructions    Complete by:  As directed    Please continue taking all of your medicines as previously prescribed. You have been started on a new inhaler, Dulera, that you should use twice daily in addition to your albuterol as needed.  Please follow up with ortho in 2 weeks. It was a pleasure taking care of you. Thank you!   Increase activity slowly    Complete by:  As directed       Signed: Reymundo Poll, MD 06/05/2016, 10:28 AM   Pager: [phone]

## 2016-06-03 NOTE — Progress Notes (Signed)
PT Cancellation Note  Patient Details Name: [name] MRN: [id number] DOB: 22-Aug-1955   Cancelled Treatment:    Reason Eval/Treat Not Completed: Patient declined, no reason specified. Pt declining therapy session at this time, stating that he was leaving today and did not need anything else from PT. Pt stated that he had been up and walking within his room all day. PT will continue to f/u with pt as appropriate.   Alessandra BevelsJennifer M Adalee Kathan 06/03/2016, 2:38 PM Deborah ChalkJennifer Kristoph Sattler, PT, DPT [phone]

## 2016-06-03 NOTE — Progress Notes (Signed)
Per MD request, CSW confirmed that patient's sister will be picking him up at 6pm today to take him to their nephew's house.  CSW signing off.  Osborne Cascoadia Lesly Joslyn LCSWA [phone]

## 2016-06-04 ENCOUNTER — Telehealth: Payer: Self-pay

## 2016-06-06 NOTE — Assessment & Plan Note (Addendum)
Long smoking history.  Cut down to smoking occasionally when he had access to cigarettes in prison.  Since release, will occasionally puff on a cigarette, with 1-2 lasting him a day.  Denies dyspnea, chronic cough.  However, he frequently used albuterol inhaler and was released with order for LAMA/LABA inhaler.  Current medications: Dulera 100-5 BID, albuterol inhaler PRN  Discussed how smoking will slow his wound healing. -continue current meds -PFTs -Smoking cessation

## 2016-06-06 NOTE — Progress Notes (Signed)
   CC: pain in left ankle  HPI:  [name] is a 60 y.o. man with history of chronic HCV (with cirrhosis, treated with Harvoni), COPD, hypothyroidism, HTN, incarceration (released 02/2016)  He was admitted 10/20-10/26/17 with a trimalleolar fracture of his left ankle, which was repaired by ORIF on 10/22.  During the admission, he was started on [facility]ulera for his COPD, and his Synthroid was increased from 125 to 150 mcg daily.  Since discharge, he has been non-weight bearing on the left leg.  He has finished the oxycodone he was prescribed at discharge, and says pain in his left ankle is still agonizing and keeps him up at night.  He has not yet made a follow-up appointment with orthopedics, but his sister says she will make sure they call immediately.  Told in prison abnormal heart rate/rhythm, had multiple EKGs over time.  Unsure of what arrhythmia they were concerned about.  He was released from prison in July after 10 years.  Is living with his sister.  Past Medical History:  Diagnosis Date  . Anxiety   . Arthritis   . Bipolar disorder (HCC)    [facility]  . Cardiomegaly   . Chronic hepatitis C (HCC)    treated with Harvoni while incarcerated.  . Cirrhosis (HCC)    told by correctional facility providers he has cirrhosis.  Marland Kitchen. COPD (chronic obstructive pulmonary disease) (HCC)   . Gall stones   . GERD (gastroesophageal reflux disease)   . Hx of bacterial pneumonia 2015  . Hypercholesteremia   . Hypertension   . Peptic ulcer disease   . Thyroid disease     Review of Systems:   Review of Systems  Constitutional: Negative for chills and fever.  Respiratory: Negative for cough and shortness of breath.   Cardiovascular: Negative for chest pain and palpitations.  Gastrointestinal: Negative for abdominal pain, constipation, diarrhea and vomiting.  Genitourinary: Negative for dysuria and hematuria.  Musculoskeletal: Positive for falls and joint pain.   Physical  Exam:  Vitals:   06/07/16 1604  BP: 129/89  Pulse: 83  Temp: 98.1 F (36.7 C)  TempSrc: Oral  SpO2: 100%  Height: 5\' 11"  (1.803 m)   Physical Exam  Constitutional: He is oriented to person, place, and time.  Well developed man in no distress.  Sitting in wheelchair, left leg splinted  Neck: Normal range of motion. Neck supple. No thyromegaly present.  Cardiovascular: Normal rate and regular rhythm.   Pulmonary/Chest: Effort normal and breath sounds normal. He has no wheezes. He has no rales.  Abdominal: Soft. He exhibits no distension. There is no tenderness.  Musculoskeletal:  L toes warm, sensation intact, voluntary movement intact  Neurological: He is alert and oriented to person, place, and time.  Skin: Skin is warm and dry.  Psychiatric: He has a normal mood and affect. His behavior is normal.    Assessment & Plan:   See Encounters Tab for problem based charting.  Patient seen with Dr. Oswaldo DoneVincent

## 2016-06-06 NOTE — Assessment & Plan Note (Addendum)
Surgically repaired on 05/30/16 by Dr Duwayne HeckJason Rogers.  He was immobilized in a short leg splint and made non weight bearing for 6 weeks.  Instructed to schedule 2 week follow-up with Dr Aundria Rudogers at discharge, which he has not yet done.  Still complaining of significant post-surgical pain. -Follow-up with orthopedics -Short Tramadol course -No further narcotics for acute pain

## 2016-06-06 NOTE — Assessment & Plan Note (Addendum)
Lab Results  Component Value Date   TSH 27.555 (H) 05/29/2016   Levothyroxine recently increased from 125 to 150 mcg daily during admission last week.  Current medications: levothyroxine 150 mcg daily  -Continue current meds -Check TSH at next appointment

## 2016-06-06 NOTE — Assessment & Plan Note (Addendum)
BP Readings from Last 3 Encounters:  06/07/16 129/89  06/03/16 129/86  04/30/16 137/85   Lab Results  Component Value Date   CREATININE 0.89 06/03/2016   Lab Results  Component Value Date   K 4.0 06/03/2016    Current medications: lisinopril 20 mg daily, propranolol 10 mg BID, prazosin 1 mg QHS  Unclear why he is on propranolol.  Reports possible history of palpitations.  Assessment BP goal: 140/90 BP control: well controlled  Plan Medications: continue current meds Other: -[facility] -d/c propranolol at next visit unless indication found in records

## 2016-06-07 ENCOUNTER — Ambulatory Visit (INDEPENDENT_AMBULATORY_CARE_PROVIDER_SITE_OTHER): Payer: Self-pay | Admitting: Internal Medicine

## 2016-06-07 ENCOUNTER — Encounter: Payer: Self-pay | Admitting: Internal Medicine

## 2016-06-07 VITALS — BP 129/89 | HR 83 | Temp 98.1°F | Ht 71.0 in

## 2016-06-07 DIAGNOSIS — I10 Essential (primary) hypertension: Secondary | ICD-10-CM

## 2016-06-07 DIAGNOSIS — F17210 Nicotine dependence, cigarettes, uncomplicated: Secondary | ICD-10-CM

## 2016-06-07 DIAGNOSIS — G8918 Other acute postprocedural pain: Secondary | ICD-10-CM

## 2016-06-07 DIAGNOSIS — X58XXXD Exposure to other specified factors, subsequent encounter: Secondary | ICD-10-CM

## 2016-06-07 DIAGNOSIS — B182 Chronic viral hepatitis C: Secondary | ICD-10-CM

## 2016-06-07 DIAGNOSIS — J449 Chronic obstructive pulmonary disease, unspecified: Secondary | ICD-10-CM

## 2016-06-07 DIAGNOSIS — Z23 Encounter for immunization: Secondary | ICD-10-CM

## 2016-06-07 DIAGNOSIS — K7460 Unspecified cirrhosis of liver: Secondary | ICD-10-CM

## 2016-06-07 DIAGNOSIS — Z79899 Other long term (current) drug therapy: Secondary | ICD-10-CM

## 2016-06-07 DIAGNOSIS — S82852A Displaced trimalleolar fracture of left lower leg, initial encounter for closed fracture: Secondary | ICD-10-CM

## 2016-06-07 DIAGNOSIS — E039 Hypothyroidism, unspecified: Secondary | ICD-10-CM

## 2016-06-07 DIAGNOSIS — S82852D Displaced trimalleolar fracture of left lower leg, subsequent encounter for closed fracture with routine healing: Secondary | ICD-10-CM

## 2016-06-07 MED ORDER — TRAMADOL HCL 50 MG PO TABS: 50.0000 mg | ORAL_TABLET | Freq: Four times a day (QID) | ORAL | 0 refills | Status: AC | PRN

## 2016-06-07 NOTE — Patient Instructions (Addendum)
Your blood pressure is under good control - good job!  For your breathing, I would like get some breathing tests to see how your lungs are working.  Keep using the [facility] twice a day.  Make sure you schedule and appointment with the orthopedic surgeon ASAP.  Once we have medical records from the last 10 years we can also see what else needs to be done to help you.

## 2016-06-08 ENCOUNTER — Encounter: Payer: Self-pay | Admitting: Internal Medicine

## 2016-06-08 DIAGNOSIS — B182 Chronic viral hepatitis C: Secondary | ICD-10-CM | POA: Insufficient documentation

## 2016-06-08 DIAGNOSIS — K7460 Unspecified cirrhosis of liver: Secondary | ICD-10-CM | POA: Insufficient documentation

## 2016-06-08 MED ORDER — LEVOTHYROXINE SODIUM 150 MCG PO TABS: 150.0000 ug | ORAL_TABLET | Freq: Every day | ORAL | 0 refills | Status: DC

## 2016-06-08 NOTE — Assessment & Plan Note (Signed)
Reports being told in prison that he had cirrhosis from chronic HCV.  Mild thrombocytopenia and normal INR on recent labs, no evidence of significant hepatosynthetic deficit.  No ascites or stigmata of cirrhosis on exam. -[facility]

## 2016-06-08 NOTE — Assessment & Plan Note (Signed)
Reports history of chronic HCV infection treated with Harvoni while incarcerated.  -[facility]

## 2016-06-09 NOTE — Progress Notes (Signed)
Internal Medicine Clinic Attending  I saw and evaluated the patient.  I personally confirmed the key portions of the history and exam documented by Dr. O'Sullivan and I reviewed pertinent patient test results.  The assessment, diagnosis, and plan were formulated together and I agree with the documentation in the resident's note.   

## 2016-06-12 ENCOUNTER — Other Ambulatory Visit: Payer: Self-pay | Admitting: Internal Medicine

## 2016-06-12 DIAGNOSIS — I10 Essential (primary) hypertension: Secondary | ICD-10-CM

## 2016-06-15 MED FILL — oxyCODONE HCL 5 MG TABS: 5 | 11 days supply | Qty: 100 | Fill #0

## 2016-07-16 ENCOUNTER — Telehealth: Payer: Self-pay | Admitting: Internal Medicine

## 2016-07-16 NOTE — Telephone Encounter (Signed)
APT. REMINDER CALL, LMTCB °

## 2016-07-19 ENCOUNTER — Ambulatory Visit (INDEPENDENT_AMBULATORY_CARE_PROVIDER_SITE_OTHER): Payer: Self-pay | Admitting: Internal Medicine

## 2016-07-19 ENCOUNTER — Encounter (INDEPENDENT_AMBULATORY_CARE_PROVIDER_SITE_OTHER): Payer: Self-pay

## 2016-07-19 ENCOUNTER — Encounter: Payer: Self-pay | Admitting: Internal Medicine

## 2016-07-19 VITALS — BP 140/89 | HR 70 | Temp 97.4°F | Ht 71.0 in | Wt 194.2 lb

## 2016-07-19 DIAGNOSIS — Z79899 Other long term (current) drug therapy: Secondary | ICD-10-CM

## 2016-07-19 DIAGNOSIS — E039 Hypothyroidism, unspecified: Secondary | ICD-10-CM

## 2016-07-19 DIAGNOSIS — G8929 Other chronic pain: Secondary | ICD-10-CM

## 2016-07-19 DIAGNOSIS — J449 Chronic obstructive pulmonary disease, unspecified: Secondary | ICD-10-CM

## 2016-07-19 DIAGNOSIS — K7460 Unspecified cirrhosis of liver: Secondary | ICD-10-CM

## 2016-07-19 DIAGNOSIS — F319 Bipolar disorder, unspecified: Secondary | ICD-10-CM

## 2016-07-19 DIAGNOSIS — M21962 Unspecified acquired deformity of left lower leg: Secondary | ICD-10-CM

## 2016-07-19 DIAGNOSIS — M25562 Pain in left knee: Secondary | ICD-10-CM

## 2016-07-19 DIAGNOSIS — M1712 Unilateral primary osteoarthritis, left knee: Secondary | ICD-10-CM

## 2016-07-19 DIAGNOSIS — F3170 Bipolar disorder, currently in remission, most recent episode unspecified: Secondary | ICD-10-CM

## 2016-07-19 DIAGNOSIS — I10 Essential (primary) hypertension: Secondary | ICD-10-CM

## 2016-07-19 DIAGNOSIS — B182 Chronic viral hepatitis C: Secondary | ICD-10-CM

## 2016-07-19 DIAGNOSIS — Z8619 Personal history of other infectious and parasitic diseases: Secondary | ICD-10-CM

## 2016-07-19 DIAGNOSIS — Z0000 Encounter for general adult medical examination without abnormal findings: Secondary | ICD-10-CM

## 2016-07-19 MED ORDER — PROPRANOLOL HCL 10 MG PO TABS: 10.0000 mg | ORAL_TABLET | Freq: Two times a day (BID) | ORAL | 0 refills | Status: DC

## 2016-07-19 MED ORDER — UMECLIDINIUM-VILANTEROL 62.5-25 MCG/INH IN AEPB: 1.0000 | INHALATION_SPRAY | Freq: Every day | RESPIRATORY_TRACT | Status: DC

## 2016-07-19 NOTE — Assessment & Plan Note (Addendum)
BP Readings from Last 3 Encounters:  07/19/16 140/89  06/07/16 129/89  06/03/16 129/86   Lab Results  Component Value Date   CREATININE 0.89 06/03/2016   Lab Results  Component Value Date   K 4.0 06/03/2016    Current medications: lisinopril 20 mg daily, propranolol 10 mg BID, prazosin 1 mg QHS  Previously on HCTZ 25 mg, discontinued in 02/2016 due to cost  Indication for propranolol unclear, may be esophageal varices  Assessment BP goal: 140/90 BP control: controlled  Plan Medications: continue current meds Other:

## 2016-07-19 NOTE — Assessment & Plan Note (Addendum)
[facility] with [facility] for [facility].  Reports improved symptoms of depression and compliance with meds.  [facility] 07/19/2016 06/07/2016 04/30/2016  Decreased Interest 2 3 3   Down, Depressed, Hopeless 2 3 3   PHQ - 2 Score 4 6 6   Altered sleeping 2 3 3   Tired, decreased energy 3 3 3   Change in appetite 0 3 3  Feeling bad or failure about yourself  0 3 3  Trouble concentrating 0 3 3  Moving slowly or fidgety/restless 3 3 3   Suicidal thoughts 0 0 0  PHQ-9 Score 12 24 24   Difficult doing work/chores Somewhat difficult Not difficult at all Very difficult     Current medications: quetiapine 50 mg QAM and 200 mg QHS, prazosin 1 mg QHS  Previous medications: unknown  Assessment Stable mood, not function limiting currently.  Plan  Medications: continue current meds Other: [facility]

## 2016-07-19 NOTE — Patient Instructions (Addendum)
Your blood pressure is doing well.  We'll keep an eye on it, but no changes to medicines right now.  For knee pain, you can take over the counter Aleve and Tylenol as written on the bottle.  Makes sure not to take more than 3000 mg Tylenol per day.  You can alternate Aleve and Tylenol on the same day.  We'll check your thyroid level again today.  Keep following up with Dr Aundria Rudogers about your ankle and knee.

## 2016-07-19 NOTE — Progress Notes (Signed)
   CC: left knee instability and pain  HPI:  Mr.Reeve N Larina BrasStone is a 60 y.o. man with history of chronic HCV (successfully treated), COPD, hypothyroidism, and HTN, who presents for management of hypothyroidism.  Please see A&P for status of the patient's chronic medical conditions.  He had ORIF for trimalleolar fracture of his left ankle on 05/30/16 by Dr Duwayne HeckJason Rogers.  Last saw Dr Aundria Rudogers week, fracture not healed yet, continued non-weight bearing.  He now had [facility] orange card and [place] [facility].  Past Medical History:  Diagnosis Date  . Anxiety   . Arthritis   . Bipolar disorder (HCC)    [facility]  . Cardiomegaly   . Chronic hepatitis C (HCC)    treated with Harvoni while incarcerated.  . Cirrhosis (HCC)    told by correctional facility providers he has cirrhosis.  Marland Kitchen. COPD (chronic obstructive pulmonary disease) (HCC)   . Gall stones   . GERD (gastroesophageal reflux disease)   . Hx of bacterial pneumonia 2015  . Hypercholesteremia   . Hypertension   . Peptic ulcer disease   . Thyroid disease     Review of Systems:   Review of Systems  Constitutional: Positive for malaise/fatigue. Negative for chills and fever.  Respiratory: Negative for shortness of breath and wheezing.   Cardiovascular: Negative for chest pain.  Gastrointestinal: Negative for abdominal pain, diarrhea, nausea and vomiting.  Musculoskeletal: Positive for joint pain.    Physical Exam:  Vitals:   07/19/16 1337  BP: 140/89  Pulse: 70  Temp: 97.4 F (36.3 C)  TempSrc: Oral  SpO2: 100%  Weight: 194 lb 3.2 oz (88.1 kg)  Height: 5\' 11"  (1.803 m)   Physical Exam  Constitutional: He is oriented to person, place, and time. He appears well-developed and well-nourished. No distress.  Cardiovascular: Normal rate and regular rhythm.   Pulmonary/Chest: Effort normal and breath sounds normal.  Musculoskeletal:  L knee with gross bony enlargement Anterior, posterior drawer negative Varus and valgus  stress negative Increased valgus laxity Diffusely tender to palpation and manipulation No effusion L quadriceps atrophy  Neurological: He is alert and oriented to person, place, and time.  Psychiatric: He has a normal mood and affect. His behavior is normal.    Assessment & Plan:   See Encounters Tab for problem based charting.  Patient seen with Dr. Oswaldo DoneVincent

## 2016-07-19 NOTE — Assessment & Plan Note (Addendum)
Reports history of chronic HCV infection treated with Harvoni while incarcerated.  HCV undetectable by PCR on 10/21, treatment seems to have produced virologic cure.

## 2016-07-19 NOTE — Assessment & Plan Note (Addendum)
Levothyroxine increased from 125 to 150 mcg daily during admission in 05/2016.  Continues to feel cold, low energy.  Denies skin dryness, hair loss, weight gain.  Lab Results  Component Value Date   TSH 27.555 (H) 05/29/2016   Current medications: levothyroxine 150 mcg daily  -Continue current levothyroxine -Check TSH  ADDENDUM 07/21/2016  Lab Results  Component Value Date   TSH 14.420 (H) 07/19/2016   TSH remains elevated.  -Increase synthroid to 175 mcg daily -Check TSH, free T4 in 8 weeks
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
## 2016-07-19 NOTE — Assessment & Plan Note (Addendum)
Was told in prison that he had cirrhosis, and had multiple EGDs.  Was released with prescriptions for lactulose and propranolol, the later possibly for prophylaxis of variceal bleeding.  He has now not taken lactulose in almost 6 months without symptoms of encephalopathy.  Hepatic Function Latest Ref Rng & Units 05/28/2016  Total Protein 6.5 - 8.1 g/dL 7.2  Albumin 3.5 - 5.0 g/dL 3.8  AST 15 - 41 U/L 32  ALT 17 - 63 U/L 20  Alk Phosphatase 38 - 126 U/L 81  Total Bilirubin 0.3 - 1.2 mg/dL 1.1   Lab Results  Component Value Date   INR 1.12 05/29/2016   No stigmata of cirrhosis.  MELD 8, MELD-Na 12, Child-Pugh A. -Continue propranolol 10 mg BID -Consider GI referral in future  

## 2016-07-19 NOTE — Assessment & Plan Note (Addendum)
Long smoking history.  Cut down to smoking occasionally when he had access to cigarettes in prison.  Since release, will occasionally puff on a cigarette, with 1-2 lasting him a day.  Denies dyspnea, chronic cough.  However, he frequently used albuterol inhaler and was released with order for LAMA/LABA inhaler.  Current medications: Anoro daily, albuterol inhaler PRN  -continue current meds -PFTs -Smoking cessation

## 2016-07-20 LAB — LIPID PANEL
CHOLESTEROL TOTAL: 144 mg/dL (ref 100–199)
Chol/HDL Ratio: 2.9 ratio units (ref 0.0–5.0)
HDL: 50 mg/dL (ref >39)
LDL Calculated: 79 mg/dL (ref 0–99)
TRIGLYCERIDES: 74 mg/dL (ref 0–149)
VLDL CHOLESTEROL CAL: 15 mg/dL (ref 5–40)

## 2016-07-20 LAB — TSH: TSH: 14.42 u[IU]/mL — ABNORMAL HIGH (ref 0.450–4.500)

## 2016-07-20 NOTE — Progress Notes (Signed)
Internal Medicine Clinic Attending  I saw and evaluated the patient.  I personally confirmed the key portions of the history and exam documented by Dr. O'Sullivan and I reviewed pertinent patient test results.  The assessment, diagnosis, and plan were formulated together and I agree with the documentation in the resident's note.   

## 2016-07-21 MED ORDER — LEVOTHYROXINE SODIUM 175 MCG PO TABS: 175.0000 ug | ORAL_TABLET | Freq: Every day | ORAL | 0 refills | Status: DC

## 2016-07-21 NOTE — Addendum Note (Signed)
Addended by: Liana Crocker'SULLIVAN, Treven Holtman L on: 07/21/2016 03:56 PM   Modules accepted: Orders

## 2016-07-29 MED FILL — traMADol HCL 50 MG TABS: 50 | 19 days supply | Qty: 75 | Fill #0

## 2016-08-17 MED FILL — HYDROCODON-APAP 7.5-325: 7.5-325 | 7 days supply | Qty: 54 | Fill #0

## 2016-09-01 ENCOUNTER — Telehealth: Payer: Self-pay | Admitting: Internal Medicine

## 2016-09-06 ENCOUNTER — Other Ambulatory Visit: Payer: Self-pay

## 2016-09-07 ENCOUNTER — Other Ambulatory Visit: Payer: Self-pay | Admitting: Internal Medicine

## 2016-09-07 ENCOUNTER — Other Ambulatory Visit (INDEPENDENT_AMBULATORY_CARE_PROVIDER_SITE_OTHER): Payer: Self-pay

## 2016-09-07 DIAGNOSIS — E039 Hypothyroidism, unspecified: Secondary | ICD-10-CM

## 2016-09-21 ENCOUNTER — Other Ambulatory Visit: Payer: Self-pay | Admitting: Internal Medicine

## 2016-09-21 DIAGNOSIS — J449 Chronic obstructive pulmonary disease, unspecified: Secondary | ICD-10-CM

## 2016-09-21 DIAGNOSIS — K219 Gastro-esophageal reflux disease without esophagitis: Secondary | ICD-10-CM

## 2016-10-07 ENCOUNTER — Other Ambulatory Visit: Payer: Self-pay

## 2016-11-08 ENCOUNTER — Other Ambulatory Visit: Payer: Self-pay | Admitting: Internal Medicine

## 2016-11-08 DIAGNOSIS — E039 Hypothyroidism, unspecified: Secondary | ICD-10-CM

## 2016-11-30 ENCOUNTER — Encounter (INDEPENDENT_AMBULATORY_CARE_PROVIDER_SITE_OTHER): Payer: Self-pay

## 2016-11-30 ENCOUNTER — Other Ambulatory Visit (INDEPENDENT_AMBULATORY_CARE_PROVIDER_SITE_OTHER): Payer: Self-pay

## 2016-11-30 DIAGNOSIS — E039 Hypothyroidism, unspecified: Secondary | ICD-10-CM

## 2016-12-01 LAB — TSH: TSH: 0.093 u[IU]/mL — ABNORMAL LOW (ref 0.450–4.500)

## 2016-12-01 LAB — T4, FREE: FREE T4: 1.43 ng/dL (ref 0.82–1.77)

## 2016-12-10 ENCOUNTER — Telehealth: Payer: Self-pay | Admitting: Internal Medicine

## 2016-12-10 NOTE — Telephone Encounter (Signed)
CALLED PATIENT, LMTCB, TIME TO RENEW GCCN CARD °

## 2016-12-13 ENCOUNTER — Other Ambulatory Visit: Payer: Self-pay | Admitting: Internal Medicine

## 2016-12-13 ENCOUNTER — Encounter: Payer: Self-pay | Admitting: Internal Medicine

## 2016-12-13 DIAGNOSIS — E039 Hypothyroidism, unspecified: Secondary | ICD-10-CM

## 2016-12-13 MED ORDER — LEVOTHYROXINE SODIUM 150 MCG PO TABS: 150.0000 ug | ORAL_TABLET | Freq: Every day | ORAL | 2 refills | Status: DC

## 2017-01-14 ENCOUNTER — Encounter: Payer: Self-pay | Admitting: *Deleted

## 2017-01-31 DIAGNOSIS — F4310 Post-traumatic stress disorder, unspecified: Secondary | ICD-10-CM | POA: Insufficient documentation

## 2017-01-31 DIAGNOSIS — F1410 Cocaine abuse, uncomplicated: Secondary | ICD-10-CM | POA: Insufficient documentation

## 2017-01-31 DIAGNOSIS — M25562 Pain in left knee: Secondary | ICD-10-CM | POA: Insufficient documentation

## 2017-02-02 ENCOUNTER — Other Ambulatory Visit: Payer: Self-pay | Admitting: Internal Medicine

## 2017-02-02 DIAGNOSIS — R109 Unspecified abdominal pain: Secondary | ICD-10-CM

## 2017-02-10 ENCOUNTER — Ambulatory Visit
Admission: RE | Admit: 2017-02-10 | Discharge: 2017-02-10 | Disposition: A | Discharge status: Home / Self Care | Payer: Medicaid Other | Source: Ambulatory Visit | Attending: Internal Medicine | Admitting: Internal Medicine

## 2017-02-10 DIAGNOSIS — R109 Unspecified abdominal pain: Secondary | ICD-10-CM

## 2017-02-10 MED ORDER — IOPAMIDOL (ISOVUE-300) INJECTION 61%
100.0000 mL | Freq: Once | INTRAVENOUS | Status: AC | PRN
  Administered 2017-02-10: 100 mL via INTRAVENOUS

## 2017-09-09 ENCOUNTER — Other Ambulatory Visit: Payer: Self-pay | Admitting: Internal Medicine

## 2017-09-09 ENCOUNTER — Ambulatory Visit
Admission: RE | Admit: 2017-09-09 | Discharge: 2017-09-09 | Disposition: A | Discharge status: Home / Self Care | Payer: Medicaid Other | Source: Ambulatory Visit | Attending: Internal Medicine | Admitting: Internal Medicine

## 2017-09-09 DIAGNOSIS — J410 Simple chronic bronchitis: Secondary | ICD-10-CM

## 2018-03-16 NOTE — Telephone Encounter (Signed)
Opened in Error.

## 2018-04-12 ENCOUNTER — Other Ambulatory Visit: Payer: Self-pay | Admitting: Internal Medicine

## 2018-04-12 DIAGNOSIS — G8191 Hemiplegia, unspecified affecting right dominant side: Secondary | ICD-10-CM

## 2018-04-12 DIAGNOSIS — G8190 Hemiplegia, unspecified affecting unspecified side: Secondary | ICD-10-CM

## 2018-04-18 ENCOUNTER — Inpatient Hospital Stay
Admission: RE | Admit: 2018-04-18 | Discharge: 2018-04-18 | Disposition: A | Discharge status: Home / Self Care | Payer: Medicaid Other | Source: Ambulatory Visit | Attending: Internal Medicine | Admitting: Internal Medicine

## 2018-04-29 ENCOUNTER — Encounter (HOSPITAL_COMMUNITY): Payer: Self-pay | Admitting: *Deleted

## 2018-04-29 ENCOUNTER — Emergency Department (HOSPITAL_COMMUNITY): Payer: Medicaid Other

## 2018-04-29 ENCOUNTER — Other Ambulatory Visit: Payer: Self-pay

## 2018-04-29 ENCOUNTER — Emergency Department (HOSPITAL_COMMUNITY)
Admission: EM | Admit: 2018-04-29 | Discharge: 2018-04-29 | Disposition: A | Discharge status: Home / Self Care | Payer: Medicaid Other | Attending: Emergency Medicine | Admitting: Emergency Medicine

## 2018-04-29 DIAGNOSIS — R296 Repeated falls: Secondary | ICD-10-CM | POA: Diagnosis not present

## 2018-04-29 DIAGNOSIS — F17210 Nicotine dependence, cigarettes, uncomplicated: Secondary | ICD-10-CM | POA: Diagnosis not present

## 2018-04-29 DIAGNOSIS — R569 Unspecified convulsions: Secondary | ICD-10-CM | POA: Insufficient documentation

## 2018-04-29 DIAGNOSIS — R4182 Altered mental status, unspecified: Secondary | ICD-10-CM | POA: Insufficient documentation

## 2018-04-29 LAB — URINALYSIS, ROUTINE W REFLEX MICROSCOPIC
Bilirubin Urine: NEGATIVE
GLUCOSE, UA: NEGATIVE mg/dL
Hgb urine dipstick: NEGATIVE
KETONES UR: NEGATIVE mg/dL
LEUKOCYTES UA: NEGATIVE
Nitrite: NEGATIVE
PH: 6 (ref 5.0–8.0)
Protein, ur: NEGATIVE mg/dL
Specific Gravity, Urine: 1.006 (ref 1.005–1.030)

## 2018-04-29 LAB — TYPE AND SCREEN
ABO/RH(D): A POS
ANTIBODY SCREEN: NEGATIVE
Unit division: 0
Unit division: 0

## 2018-04-29 LAB — BPAM FFP
BLOOD PRODUCT EXPIRATION DATE: 201910112359
Blood Product Expiration Date: 201910102359
ISSUE DATE / TIME: 201909210235
ISSUE DATE / TIME: 201909210235
Unit Type and Rh: 6200
Unit Type and Rh: 6200

## 2018-04-29 LAB — RAPID URINE DRUG SCREEN, HOSP PERFORMED
Amphetamines: NOT DETECTED
BARBITURATES: NOT DETECTED
BENZODIAZEPINES: NOT DETECTED
COCAINE: POSITIVE — AB
Opiates: NOT DETECTED
Tetrahydrocannabinol: NOT DETECTED

## 2018-04-29 LAB — I-STAT CG4 LACTIC ACID, ED: Lactic Acid, Venous: 3.76 mmol/L (ref 0.5–1.9)

## 2018-04-29 LAB — PREPARE FRESH FROZEN PLASMA
UNIT DIVISION: 0
Unit division: 0

## 2018-04-29 LAB — I-STAT CHEM 8, ED
BUN: 6 mg/dL — ABNORMAL LOW (ref 8–23)
Calcium, Ion: 1.07 mmol/L — ABNORMAL LOW (ref 1.15–1.40)
Chloride: 102 mmol/L (ref 98–111)
Creatinine, Ser: 1.4 mg/dL — ABNORMAL HIGH (ref 0.61–1.24)
Glucose, Bld: 91 mg/dL (ref 70–99)
HEMATOCRIT: 42 % (ref 39.0–52.0)
HEMOGLOBIN: 14.3 g/dL (ref 13.0–17.0)
POTASSIUM: 3.6 mmol/L (ref 3.5–5.1)
SODIUM: 137 mmol/L (ref 135–145)
TCO2: 20 mmol/L — ABNORMAL LOW (ref 22–32)

## 2018-04-29 LAB — COMPREHENSIVE METABOLIC PANEL
ALBUMIN: 3.8 g/dL (ref 3.5–5.0)
ALT: 17 U/L (ref 0–44)
AST: 29 U/L (ref 15–41)
Alkaline Phosphatase: 75 U/L (ref 38–126)
Anion gap: 14 (ref 5–15)
BUN: 7 mg/dL — AB (ref 8–23)
CHLORIDE: 100 mmol/L (ref 98–111)
CO2: 21 mmol/L — AB (ref 22–32)
CREATININE: 1.28 mg/dL — AB (ref 0.61–1.24)
Calcium: 9.1 mg/dL (ref 8.9–10.3)
GFR calc non Af Amer: 59 mL/min — ABNORMAL LOW (ref >60)
GLUCOSE: 98 mg/dL (ref 70–99)
Potassium: 3.8 mmol/L (ref 3.5–5.1)
SODIUM: 135 mmol/L (ref 135–145)
Total Bilirubin: 0.9 mg/dL (ref 0.3–1.2)
Total Protein: 6.8 g/dL (ref 6.5–8.1)

## 2018-04-29 LAB — I-STAT TROPONIN, ED
TROPONIN I, POC: 0 ng/mL (ref 0.00–0.08)
Troponin i, poc: 0 ng/mL (ref 0.00–0.08)

## 2018-04-29 LAB — ETHANOL: Alcohol, Ethyl (B): 80 mg/dL — ABNORMAL HIGH (ref <10)

## 2018-04-29 LAB — CBC
HCT: 43.3 % (ref 39.0–52.0)
Hemoglobin: 14.6 g/dL (ref 13.0–17.0)
MCH: 32.7 pg (ref 26.0–34.0)
MCHC: 33.7 g/dL (ref 30.0–36.0)
MCV: 96.9 fL (ref 78.0–100.0)
PLATELETS: 122 10*3/uL — AB (ref 150–400)
RBC: 4.47 MIL/uL (ref 4.22–5.81)
RDW: 13.6 % (ref 11.5–15.5)
WBC: 5.1 10*3/uL (ref 4.0–10.5)

## 2018-04-29 LAB — PROTIME-INR
INR: 1.14
PROTHROMBIN TIME: 14.5 s (ref 11.4–15.2)

## 2018-04-29 LAB — BPAM RBC
BLOOD PRODUCT EXPIRATION DATE: 201910192359
Blood Product Expiration Date: 201910192359
ISSUE DATE / TIME: 201909210234
ISSUE DATE / TIME: 201909210234
UNIT TYPE AND RH: 9500
Unit Type and Rh: 9500

## 2018-04-29 LAB — CDS SEROLOGY

## 2018-04-29 LAB — ABO/RH: ABO/RH(D): A POS

## 2018-04-29 IMAGING — CR DG CHEST 2V
2 series · 2 of 2 positions shown · non-contrast
Comparison: 06/18/2014

CLINICAL DATA: Chronic cough.  Smoker.

EXAM:
CHEST  2 VIEW

[w chest pa]
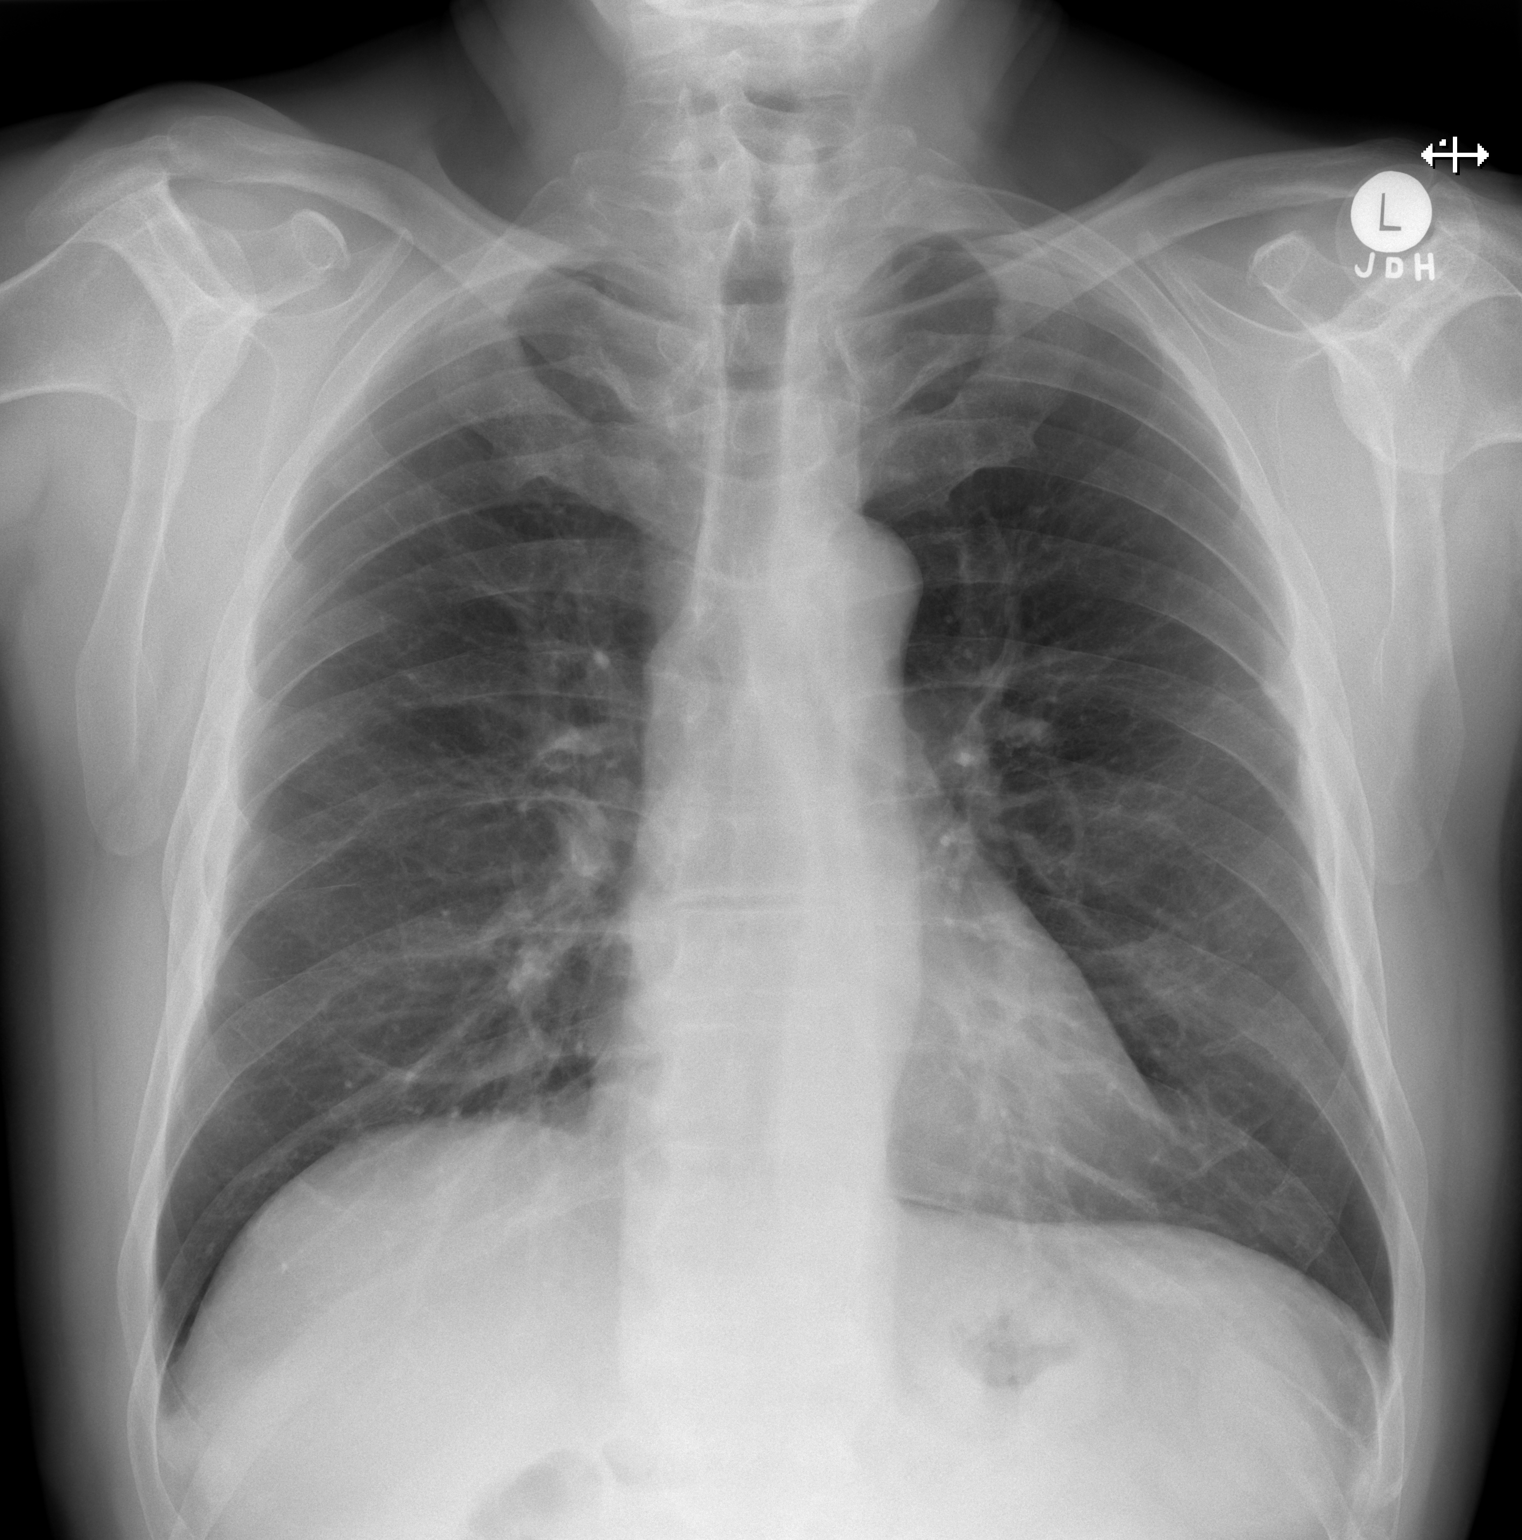

[w chest lat]
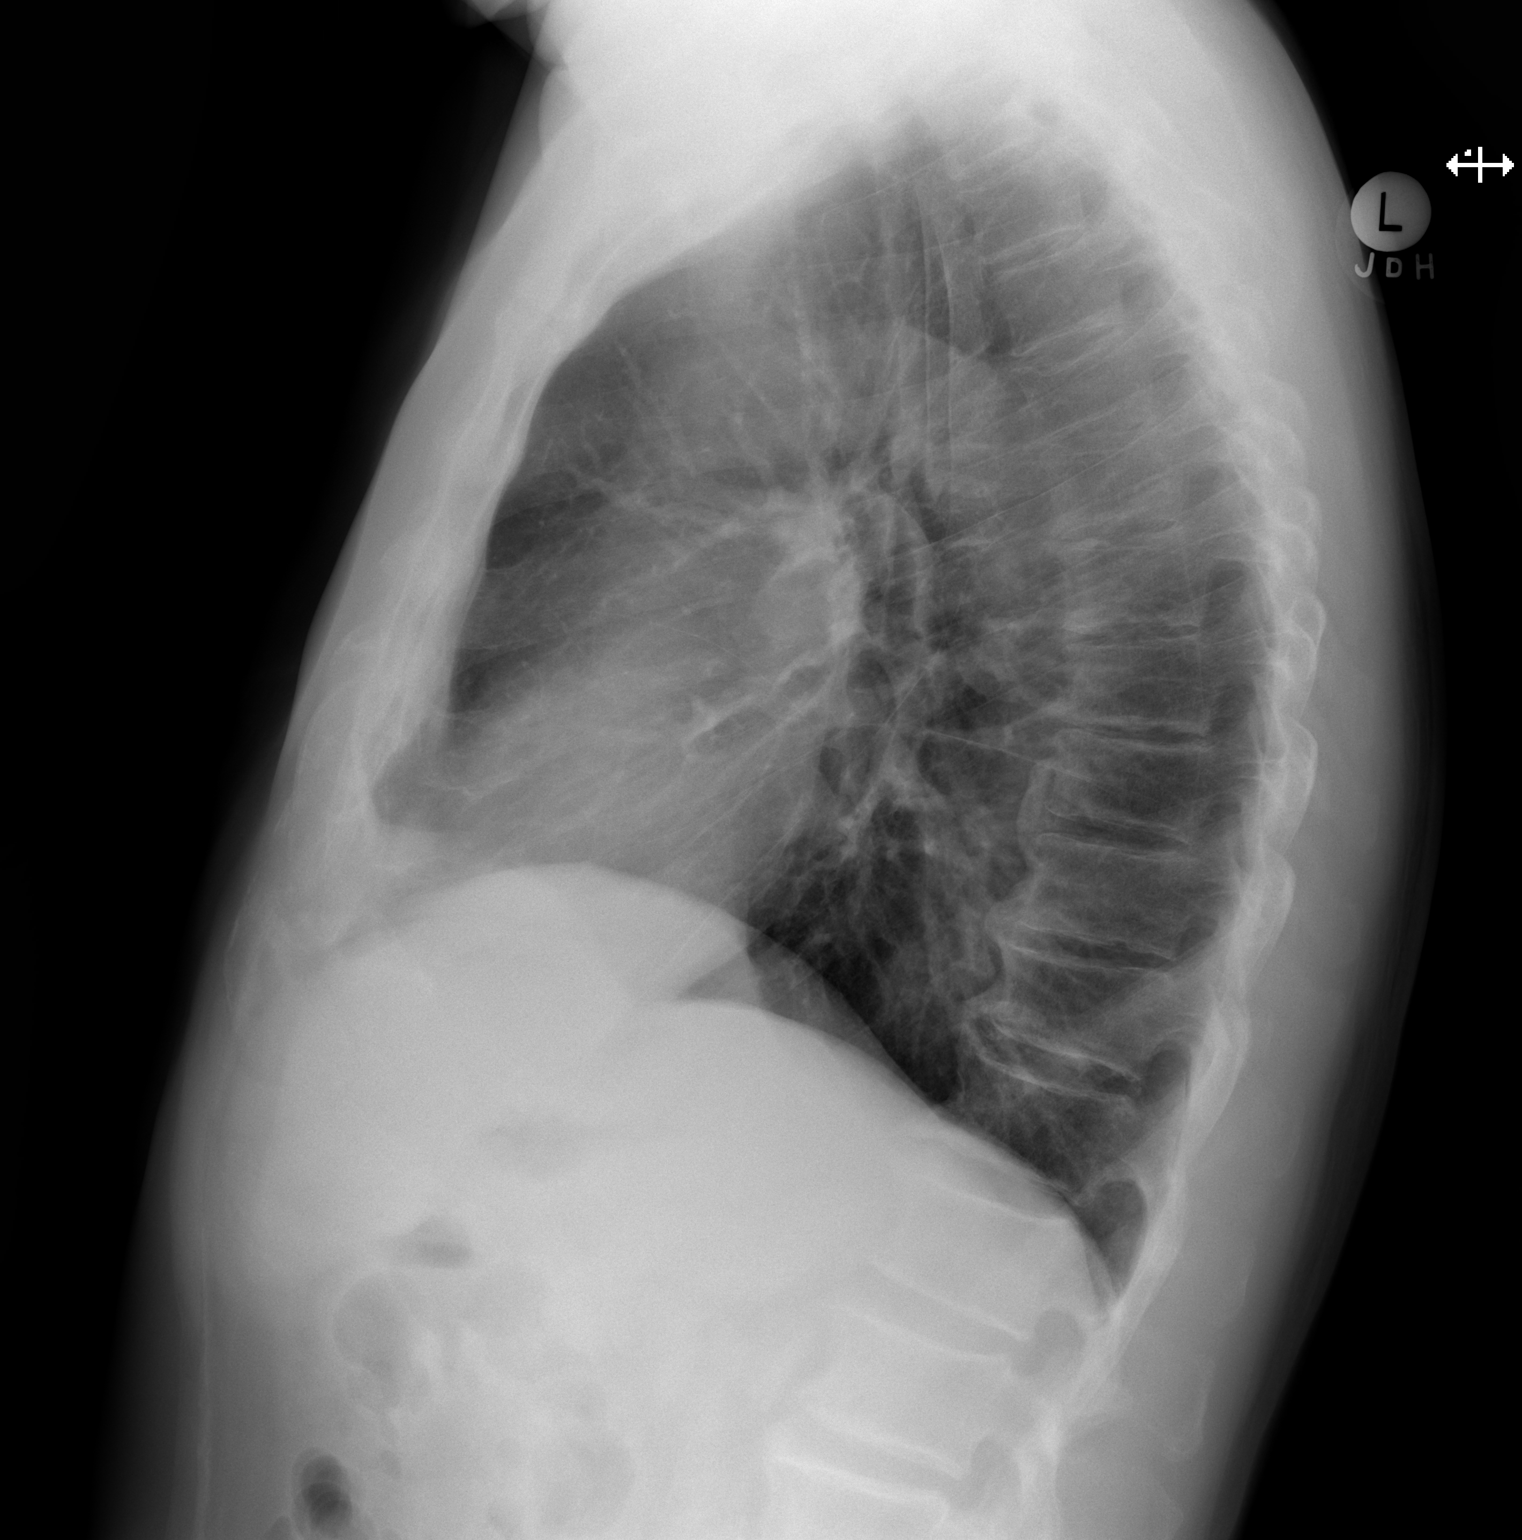

[2 of 2 positions shown; findings below may reference images not displayed]

FINDINGS: The heart size and mediastinal contours are within normal limits.
Both lungs are clear. The visualized skeletal structures are
unremarkable.
IMPRESSION: No active cardiopulmonary disease.

## 2018-04-29 MED ORDER — LEVETIRACETAM 500 MG PO TABS
1000.0000 mg | ORAL_TABLET | Freq: Once | ORAL | Status: AC
  Administered 2018-04-29: 1000 mg via ORAL
  Filled 2018-04-29: qty 2

## 2018-04-29 MED ORDER — SODIUM CHLORIDE 0.9 % IV SOLN
INTRAVENOUS | Status: DC
  Administered 2018-04-29: 04:00:00 via INTRAVENOUS

## 2018-04-29 MED ORDER — LEVETIRACETAM 500 MG PO TABS: 500.0000 mg | ORAL_TABLET | Freq: Two times a day (BID) | ORAL | 0 refills | Status: DC

## 2018-04-29 MED ORDER — IOHEXOL 300 MG/ML  SOLN
100.0000 mL | Freq: Once | INTRAMUSCULAR | Status: AC | PRN
  Administered 2018-04-29: 100 mL via INTRAVENOUS

## 2018-04-29 MED ORDER — MIDAZOLAM HCL 2 MG/2ML IJ SOLN
INTRAMUSCULAR | Status: AC
  Filled 2018-04-29: qty 2

## 2018-04-29 MED ORDER — SODIUM CHLORIDE 0.9 % IV BOLUS
1000.0000 mL | Freq: Once | INTRAVENOUS | Status: AC
  Administered 2018-04-29: 1000 mL via INTRAVENOUS

## 2018-04-29 NOTE — ED Notes (Signed)
Nasal 02 at 2 liters 

## 2018-04-29 NOTE — ED Notes (Signed)
Ems got  A gcs 13

## 2018-04-29 NOTE — ED Notes (Signed)
The pt is asking for something to drink   Dr wyatt has removed his c-collar  He reports that he is homeless and he was [place] to [place] tonight

## 2018-04-29 NOTE — ED Notes (Signed)
Fast negative 

## 2018-04-29 NOTE — ED Notes (Signed)
bp 118/72

## 2018-04-29 NOTE — ED Notes (Signed)
The pt is alert and oriented gcs 15

## 2018-04-29 NOTE — ED Notes (Signed)
P[t c/o some chest pain at present

## 2018-04-29 NOTE — Progress Notes (Signed)
I responded to a Level 1 Trauma alert and was present to provide spiritual and emotional support as needed or requested. There was no family member identified and the patient was unavailable for direct contact by the Chaplain. The Chaplain remained in the ED to assist as needed.    04/29/18 0300  Clinical Encounter Type  Visited With Patient not available  Visit Type Spiritual support  Referral From Nurse  Consult/Referral To Chaplain  Spiritual Encounters  Spiritual Needs Prayer  Stress Factors  Patient Stress Factors None identified    Chaplain Dr Melvyn NovasMichael Shreyan Hinz

## 2018-04-29 NOTE — Discharge Instructions (Signed)
No driving until cleared by neurology

## 2018-04-29 NOTE — ED Notes (Signed)
Dr Nicanor AlconPalumbo informed of lactic acid results 3.76

## 2018-04-29 NOTE — Progress Notes (Signed)
I do not find that this patient has any injuries from his recent falls.  CT scan of his head, C-spine, abdomen and pelvis appear to have no significant acute injuries.  We will sign off from trauma  This patient has been seen and I agree with the findings and treatment plan.  Marta LamasJames O. Gae BonWyatt, III, MD, FACS [phone] (pager) [phone] (direct pager) Trauma Surgeon

## 2018-04-29 NOTE — ED Notes (Signed)
Portable chest and pelvis xrays taken

## 2018-04-29 NOTE — ED Notes (Signed)
To ct and returned

## 2018-04-29 NOTE — ED Provider Notes (Signed)
[facility] EMERGENCY DEPARTMENT Provider Note   CSN: [id number] Arrival date & time: 04/29/18  0229     History   Chief Complaint Chief Complaint  Patient presents with  . Fall    HPI ZADYN YARDLEY is a 62 y.o. male.  The history is provided by the patient.  Fall  This is a new problem. The current episode started 1 to 2 hours ago. The problem occurs constantly. The problem has not changed since onset.Pertinent negatives include no chest pain, no abdominal pain, no headaches and no shortness of breath. Nothing aggravates the symptoms. Nothing relieves the symptoms. He has tried nothing for the symptoms. The treatment provided no relief.  EMS called out for multiple falls (4)   History reviewed. No pertinent past medical history.  There are no active problems to display for this patient.   History reviewed. No pertinent surgical history.      Home Medications    Prior to Admission medications   Medication Sig Start Date End Date Taking? Authorizing Provider  levETIRAcetam (KEPPRA) 500 MG tablet Take 1 tablet (500 mg total) by mouth 2 (two) times daily. 04/29/18   Tenee Wish, MD    Family History No family history on file.  Social History Social History   Tobacco Use  . Smoking status: Current Every Day Smoker  . Smokeless tobacco: Never Used  Substance Use Topics  . Alcohol use: Yes  . Drug use: Not on file     Allergies   Patient has no known allergies.   Review of Systems Review of Systems  Unable to perform ROS: Mental status change  Respiratory: Negative for shortness of breath.   Cardiovascular: Negative for chest pain.  Gastrointestinal: Negative for abdominal pain.  Neurological: Negative for headaches.     Physical Exam Updated Vital Signs BP (!) 160/87   Pulse (!) 117   Temp (!) 96.9 F (36.1 C)   Resp (!) 25   Ht 5\' 11"  (1.803 m)   Wt 90.7 kg   SpO2 98%   BMI 27.89 kg/m   Physical Exam    Constitutional: He appears well-developed and well-nourished. No distress.  HENT:  Head: Normocephalic and atraumatic.  Mouth/Throat: No oropharyngeal exudate.  Eyes: Pupils are equal, round, and reactive to light. Conjunctivae are normal.  Neck: Normal range of motion. Neck supple.  Cardiovascular: Regular rhythm, normal heart sounds and intact distal pulses. Tachycardia present.  Pulmonary/Chest: Effort normal and breath sounds normal. No stridor. No respiratory distress. He has no wheezes. He has no rales.  Abdominal: Soft. Bowel sounds are normal. He exhibits no mass. There is no tenderness. There is no rebound and no guarding.  Neurological: GCS eye subscore is 3. GCS verbal subscore is 2. GCS motor subscore is 5.  Seizure like activity    Skin: Skin is warm and dry. Capillary refill takes less than 2 seconds.  Psychiatric:  Unable   Nursing note and vitals reviewed.    ED Treatments / Results  Labs (all labs ordered are listed, but only abnormal results are displayed) Labs Reviewed  COMPREHENSIVE METABOLIC PANEL - Abnormal; Notable for the following components:      Result Value   CO2 21 (*)    BUN 7 (*)    Creatinine, Ser 1.28 (*)    GFR calc non Af Amer 59 (*)    All other components within normal limits  CBC - Abnormal; Notable for the following components:   Platelets 122 (*)  All other components within normal limits  ETHANOL - Abnormal; Notable for the following components:   Alcohol, Ethyl (B) 80 (*)    All other components within normal limits  URINALYSIS, ROUTINE W REFLEX MICROSCOPIC - Abnormal; Notable for the following components:   Color, Urine STRAW (*)    All other components within normal limits  RAPID URINE DRUG SCREEN, HOSP PERFORMED - Abnormal; Notable for the following components:   Cocaine POSITIVE (*)    All other components within normal limits  I-STAT CHEM 8, ED - Abnormal; Notable for the following components:   BUN 6 (*)    Creatinine, Ser  1.40 (*)    Calcium, Ion 1.07 (*)    TCO2 20 (*)    All other components within normal limits  I-STAT CG4 LACTIC ACID, ED - Abnormal; Notable for the following components:   Lactic Acid, Venous 3.76 (*)    All other components within normal limits  CDS SEROLOGY  PROTIME-INR  I-STAT TROPONIN, ED  I-STAT TROPONIN, ED  TYPE AND SCREEN  PREPARE FRESH FROZEN PLASMA  ABO/RH    EKG EKG Interpretation  Date/Time:  Saturday April 29 2018 02:33:25 EDT Ventricular Rate:  118 PR Interval:    QRS Duration: 93 QT Interval:  339 QTC Calculation: 475 R Axis:   55 Text Interpretation:  Sinus tachycardia Ventricular premature complex Aberrant complex Probable anteroseptal infarct, old Confirmed by Nicanor AlconPalumbo, Tye Vigo ([id number]) on 04/29/2018 2:50:20 AM   Radiology Ct Head Wo Contrast  Result Date: 04/29/2018 CLINICAL DATA:  62 y/o M; level 1 trauma with fall and head injury. Hypotensive. EXAM: CT HEAD WITHOUT CONTRAST CT CERVICAL SPINE WITHOUT CONTRAST TECHNIQUE: Multidetector CT imaging of the head and cervical spine was performed following the standard protocol without intravenous contrast. Multiplanar CT image reconstructions of the cervical spine were also generated. COMPARISON:  None. FINDINGS: CT HEAD FINDINGS Brain: No evidence of acute infarction, hemorrhage, hydrocephalus, extra-axial collection or mass lesion/mass effect. Few nonspecific hypodensities in white matter compatible with mild chronic microvascular ischemic changes and there is mild volume loss of the brain. Vascular: No hyperdense vessel or unexpected calcification. Skull: Normal. Negative for fracture or focal lesion. Sinuses/Orbits: No acute finding. Other: None. CT CERVICAL SPINE FINDINGS Alignment: Normal. Skull base and vertebrae: No acute fracture. No primary bone lesion or focal pathologic process. Soft tissues and spinal canal: No prevertebral fluid or swelling. No visible canal hematoma. Disc levels: C5-6 vertebral body  fusion. Moderate C6-7 discogenic degenerative changes with loss of intervertebral disc space height. Uncovertebral and facet hypertrophy at the C5-6 level results in mild bilateral neural foraminal encroachment. No high-grade canal stenosis. Upper chest: Negative. Other: Mild calcific atherosclerosis of the carotid bifurcations. IMPRESSION: CT head: 1. No acute intracranial abnormality or calvarial fracture. 2. Mild chronic microvascular ischemic changes and volume loss of the brain for age. CT cervical spine: 1. No acute fracture or dislocation identified. 2. Mild cervical spondylosis greatest at the C5-6 and C6-7 levels. These results were called by telephone at the time of interpretation on 04/29/2018 at 3:52 am to Dr. Lindie SpruceWyatt, who verbally acknowledged these results. Electronically Signed   By: Mitzi HansenLance  Furusawa-Stratton M.D.   On: 04/29/2018 03:53   Ct Cervical Spine Wo Contrast  Result Date: 04/29/2018 CLINICAL DATA:  62 y/o M; level 1 trauma with fall and head injury. Hypotensive. EXAM: CT HEAD WITHOUT CONTRAST CT CERVICAL SPINE WITHOUT CONTRAST TECHNIQUE: Multidetector CT imaging of the head and cervical spine was performed following the standard protocol without  intravenous contrast. Multiplanar CT image reconstructions of the cervical spine were also generated. COMPARISON:  None. FINDINGS: CT HEAD FINDINGS Brain: No evidence of acute infarction, hemorrhage, hydrocephalus, extra-axial collection or mass lesion/mass effect. Few nonspecific hypodensities in white matter compatible with mild chronic microvascular ischemic changes and there is mild volume loss of the brain. Vascular: No hyperdense vessel or unexpected calcification. Skull: Normal. Negative for fracture or focal lesion. Sinuses/Orbits: No acute finding. Other: None. CT CERVICAL SPINE FINDINGS Alignment: Normal. Skull base and vertebrae: No acute fracture. No primary bone lesion or focal pathologic process. Soft tissues and spinal canal: No  prevertebral fluid or swelling. No visible canal hematoma. Disc levels: C5-6 vertebral body fusion. Moderate C6-7 discogenic degenerative changes with loss of intervertebral disc space height. Uncovertebral and facet hypertrophy at the C5-6 level results in mild bilateral neural foraminal encroachment. No high-grade canal stenosis. Upper chest: Negative. Other: Mild calcific atherosclerosis of the carotid bifurcations. IMPRESSION: CT head: 1. No acute intracranial abnormality or calvarial fracture. 2. Mild chronic microvascular ischemic changes and volume loss of the brain for age. CT cervical spine: 1. No acute fracture or dislocation identified. 2. Mild cervical spondylosis greatest at the C5-6 and C6-7 levels. These results were called by telephone at the time of interpretation on 04/29/2018 at 3:52 am to Dr. Lindie Spruce, who verbally acknowledged these results. Electronically Signed   By: Mitzi Hansen M.D.   On: 04/29/2018 03:53   Ct Abdomen Pelvis W Contrast  Result Date: 04/29/2018 CLINICAL DATA:  62 year old male with level 1 trauma. EXAM: CT ABDOMEN AND PELVIS WITH CONTRAST TECHNIQUE: Multidetector CT imaging of the abdomen and pelvis was performed using the standard protocol following bolus administration of intravenous contrast. CONTRAST:  OMNIPAQUE IOHEXOL 300 MG/ML  SOLN COMPARISON:  Pelvic radiograph dated 04/29/2018 FINDINGS: Evaluation is limited due to streak artifact caused by patient's arms. Lower chest: Minimal bibasilar dependent atelectasis. The visualized lung bases are otherwise clear. No intra-abdominal free air or free fluid. Hepatobiliary: Mild irregularity of the liver contour with enlargement of the left lobe of the liver, most consistent with cirrhosis. No intrahepatic biliary ductal dilatation. There may be sludge or noncalcified stones in the proximal gallbladder. No pericholecystic fluid. Pancreas: Unremarkable. No pancreatic ductal dilatation or surrounding  inflammatory changes. Spleen: Normal in size without focal abnormality. Adrenals/Urinary Tract: Adrenal glands are unremarkable. Kidneys are normal, without renal calculi, focal lesion, or hydronephrosis. Bladder is unremarkable. Stomach/Bowel: There is no bowel obstruction or active inflammation. The appendix is not visualized with certainty. No inflammatory changes identified in the right lower quadrant. Vascular/Lymphatic: Moderate aortoiliac atherosclerotic disease. A 2.5 cm infrarenal aortic ectasia. Reproductive: Uterus and bilateral adnexa are unremarkable. Other: Small fat containing umbilical hernia. Musculoskeletal: Partially visualized left femoral neck fixation screw. There is osteopenia with degenerative changes of the spine. Old healed left posterior rib fracture. 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 No cortical collapse. No acute fracture. IMPRESSION: 1. No acute/traumatic intra-abdominal or pelvic pathology. 2. Nonacute findings including cirrhosis, probable noncalcified gallstones, and Aortic Atherosclerosis (ICD10-I70.0). Electronically Signed   By: Elgie Collard M.D.   On: 04/29/2018 03:51   Dg Pelvis Portable  Result Date: 04/29/2018 CLINICAL DATA:  Unresponsive after fall. EXAM: PORTABLE PELVIS 1-2 VIEWS COMPARISON:  None. FINDINGS: No acute pelvic fracture. No joint dislocations of the included lower lumbar spine, sacroiliac joints and pubic symphysis are unremarkable. No diastasis. Left femoral nail fixation of the hip without complicating features identified. IMPRESSION: Negative. Electronically Signed  By: Tollie Eth M.D.   On: 04/29/2018 03:07   Dg Chest Port 1 View  Result Date: 04/29/2018 CLINICAL DATA:  Unresponsive after fall.  Chest pain. EXAM: PORTABLE CHEST 1 VIEW COMPARISON:  None. FINDINGS: Cardiomegaly with mild aortic atherosclerosis. Mild vascular congestion is noted. No alveolar consolidation or pneumothorax. No effusion.  No acute osseous abnormality. IMPRESSION: Cardiomegaly with mild vascular congestion. No pulmonary consolidation, pneumothorax nor effusion. Minimal aortic atherosclerosis. Electronically Signed   By: Tollie Eth M.D.   On: 04/29/2018 03:05    Procedures Procedures (including critical care time)  Medications Ordered in ED Medications  sodium chloride 0.9 % bolus 1,000 mL (0 mLs Intravenous Stopped 04/29/18 0340)    And  0.9 %  sodium chloride infusion ( Intravenous New Bag/Given 04/29/18 0356)  iohexol (OMNIPAQUE) 300 MG/ML solution 100 mL (100 mLs Intravenous Contrast Given 04/29/18 0324)  levETIRAcetam (KEPPRA) tablet 1,000 mg (1,000 mg Oral Given 04/29/18 0542)     Case d/w Dr. Amada Jupiter who agrees this is likely seizure like activity     Given no driving instructions and patient agrees not to drive and to follow up   Final Clinical Impressions(s) / ED Diagnoses   Final diagnoses:  Seizure-like activity (HCC)   Return for fevers >100.4 unrelieved by medication, shortness of breath, intractable vomiting, or diarrhea, Inability to tolerate liquids or food, cough, altered mental status or any concerns. No signs of systemic illness or infection. The patient is nontoxic-appearing on exam and vital signs are within normal limits.   I have reviewed the triage vital signs and the nursing notes. Pertinent labs &imaging results that were available during my care of the patient were reviewed by me and considered in my medical decision making (see chart for details).  After history, exam, and medical workup I feel the patient has been appropriately medically screened and is safe for discharge home. Pertinent diagnoses were discussed with the patient. Patient was given return precautions.  ED Discharge Orders         Ordered    levETIRAcetam (KEPPRA) 500 MG tablet  2 times daily     04/29/18 0624           Kamea Dacosta, MD 04/29/18 1610

## 2018-04-30 ENCOUNTER — Encounter: Payer: Self-pay | Admitting: Internal Medicine

## 2018-05-28 DIAGNOSIS — G40909 Epilepsy, unspecified, not intractable, without status epilepticus: Secondary | ICD-10-CM | POA: Insufficient documentation

## 2018-05-28 DIAGNOSIS — Z5900 Homelessness unspecified: Secondary | ICD-10-CM | POA: Insufficient documentation

## 2018-07-11 ENCOUNTER — Other Ambulatory Visit: Payer: Self-pay | Admitting: Gastroenterology

## 2018-07-11 DIAGNOSIS — R1310 Dysphagia, unspecified: Secondary | ICD-10-CM

## 2018-07-17 ENCOUNTER — Ambulatory Visit
Admission: RE | Admit: 2018-07-17 | Discharge: 2018-07-17 | Disposition: A | Discharge status: Home / Self Care | Payer: Medicaid Other | Source: Ambulatory Visit | Attending: Gastroenterology | Admitting: Gastroenterology

## 2018-07-17 DIAGNOSIS — R1310 Dysphagia, unspecified: Secondary | ICD-10-CM

## 2018-08-12 ENCOUNTER — Other Ambulatory Visit: Payer: Self-pay

## 2018-08-12 ENCOUNTER — Encounter (HOSPITAL_COMMUNITY): Payer: Self-pay | Admitting: Emergency Medicine

## 2018-08-12 ENCOUNTER — Emergency Department (HOSPITAL_COMMUNITY): Payer: Medicaid Other

## 2018-08-12 ENCOUNTER — Emergency Department (HOSPITAL_COMMUNITY)
Admission: EM | Admit: 2018-08-12 | Discharge: 2018-08-12 | Disposition: A | Discharge status: Home / Self Care | Payer: Medicaid Other | Attending: Emergency Medicine | Admitting: Emergency Medicine

## 2018-08-12 DIAGNOSIS — I10 Essential (primary) hypertension: Secondary | ICD-10-CM | POA: Insufficient documentation

## 2018-08-12 DIAGNOSIS — Y939 Activity, unspecified: Secondary | ICD-10-CM | POA: Insufficient documentation

## 2018-08-12 DIAGNOSIS — J449 Chronic obstructive pulmonary disease, unspecified: Secondary | ICD-10-CM | POA: Diagnosis not present

## 2018-08-12 DIAGNOSIS — Z79899 Other long term (current) drug therapy: Secondary | ICD-10-CM | POA: Insufficient documentation

## 2018-08-12 DIAGNOSIS — E039 Hypothyroidism, unspecified: Secondary | ICD-10-CM | POA: Diagnosis not present

## 2018-08-12 DIAGNOSIS — Y92009 Unspecified place in unspecified non-institutional (private) residence as the place of occurrence of the external cause: Secondary | ICD-10-CM | POA: Insufficient documentation

## 2018-08-12 DIAGNOSIS — S82821A Torus fracture of lower end of right fibula, initial encounter for closed fracture: Secondary | ICD-10-CM | POA: Diagnosis not present

## 2018-08-12 DIAGNOSIS — S80912A Unspecified superficial injury of left knee, initial encounter: Secondary | ICD-10-CM | POA: Diagnosis present

## 2018-08-12 DIAGNOSIS — F17210 Nicotine dependence, cigarettes, uncomplicated: Secondary | ICD-10-CM | POA: Diagnosis not present

## 2018-08-12 DIAGNOSIS — W01198A Fall on same level from slipping, tripping and stumbling with subsequent striking against other object, initial encounter: Secondary | ICD-10-CM | POA: Diagnosis not present

## 2018-08-12 DIAGNOSIS — Y999 Unspecified external cause status: Secondary | ICD-10-CM | POA: Diagnosis not present

## 2018-08-12 DIAGNOSIS — W19XXXA Unspecified fall, initial encounter: Secondary | ICD-10-CM

## 2018-08-12 MED ORDER — HYDROCODONE-ACETAMINOPHEN 5-325 MG PO TABS: 1.0000 | ORAL_TABLET | ORAL | 0 refills | Status: DC | PRN

## 2018-08-12 MED ORDER — OXYCODONE-ACETAMINOPHEN 5-325 MG PO TABS
1.0000 | ORAL_TABLET | Freq: Once | ORAL | Status: AC
  Administered 2018-08-12: 1 via ORAL
  Filled 2018-08-12: qty 1

## 2018-08-12 MED ORDER — IBUPROFEN 200 MG PO TABS
600.0000 mg | ORAL_TABLET | Freq: Once | ORAL | Status: AC
  Administered 2018-08-12: 600 mg via ORAL
  Filled 2018-08-12: qty 3

## 2018-08-12 NOTE — ED Notes (Signed)
Call placed to

## 2018-08-12 NOTE — Discharge Instructions (Addendum)
Follow up with the orthopedic doctor for further evaluation and continued care.

## 2018-08-12 NOTE — ED Triage Notes (Addendum)
Pt fell last night onto tile floor and c/o bilateral ankle pain and edema, pain with weight bearing.
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
## 2018-08-18 ENCOUNTER — Other Ambulatory Visit: Payer: Self-pay | Admitting: Internal Medicine

## 2018-08-18 DIAGNOSIS — R600 Localized edema: Secondary | ICD-10-CM

## 2018-08-21 ENCOUNTER — Other Ambulatory Visit (HOSPITAL_BASED_OUTPATIENT_CLINIC_OR_DEPARTMENT_OTHER): Payer: Self-pay | Admitting: Internal Medicine

## 2018-12-19 ENCOUNTER — Emergency Department (HOSPITAL_COMMUNITY): Payer: Medicaid Other

## 2018-12-19 ENCOUNTER — Emergency Department (HOSPITAL_COMMUNITY)
Admission: EM | Admit: 2018-12-19 | Discharge: 2018-12-19 | Disposition: A | Discharge status: Home / Self Care | Payer: Medicaid Other | Attending: Emergency Medicine | Admitting: Emergency Medicine

## 2018-12-19 ENCOUNTER — Encounter (HOSPITAL_COMMUNITY): Payer: Self-pay

## 2018-12-19 ENCOUNTER — Other Ambulatory Visit: Payer: Self-pay

## 2018-12-19 DIAGNOSIS — F17210 Nicotine dependence, cigarettes, uncomplicated: Secondary | ICD-10-CM | POA: Insufficient documentation

## 2018-12-19 DIAGNOSIS — R0602 Shortness of breath: Secondary | ICD-10-CM | POA: Diagnosis present

## 2018-12-19 DIAGNOSIS — Z79899 Other long term (current) drug therapy: Secondary | ICD-10-CM | POA: Insufficient documentation

## 2018-12-19 DIAGNOSIS — J441 Chronic obstructive pulmonary disease with (acute) exacerbation: Secondary | ICD-10-CM | POA: Diagnosis not present

## 2018-12-19 DIAGNOSIS — I10 Essential (primary) hypertension: Secondary | ICD-10-CM | POA: Diagnosis not present

## 2018-12-19 DIAGNOSIS — Z20828 Contact with and (suspected) exposure to other viral communicable diseases: Secondary | ICD-10-CM | POA: Diagnosis not present

## 2018-12-19 LAB — CBC
HCT: 38.7 % — ABNORMAL LOW (ref 39.0–52.0)
Hemoglobin: 13.3 g/dL (ref 13.0–17.0)
MCH: 35.6 pg — ABNORMAL HIGH (ref 26.0–34.0)
MCHC: 34.4 g/dL (ref 30.0–36.0)
MCV: 103.5 fL — ABNORMAL HIGH (ref 80.0–100.0)
Platelets: 93 10*3/uL — ABNORMAL LOW (ref 150–400)
RBC: 3.74 MIL/uL — ABNORMAL LOW (ref 4.22–5.81)
RDW: 13.3 % (ref 11.5–15.5)
WBC: 5.8 10*3/uL (ref 4.0–10.5)
nRBC: 0 % (ref 0.0–0.2)

## 2018-12-19 LAB — BASIC METABOLIC PANEL
Anion gap: 10 (ref 5–15)
BUN: 17 mg/dL (ref 8–23)
CO2: 23 mmol/L (ref 22–32)
Calcium: 9.2 mg/dL (ref 8.9–10.3)
Chloride: 102 mmol/L (ref 98–111)
Creatinine, Ser: 1.04 mg/dL (ref 0.61–1.24)
GFR calc Af Amer: >60 mL/min (ref >60)
GFR calc non Af Amer: >60 mL/min (ref >60)
Glucose, Bld: 100 mg/dL — ABNORMAL HIGH (ref 70–99)
Potassium: 3.7 mmol/L (ref 3.5–5.1)
Sodium: 135 mmol/L (ref 135–145)

## 2018-12-19 LAB — TROPONIN I
Troponin I: <0.03 ng/mL (ref <0.03)
Troponin I: <0.03 ng/mL (ref <0.03)

## 2018-12-19 LAB — SARS CORONAVIRUS 2 BY RT PCR (HOSPITAL ORDER, PERFORMED IN ~~LOC~~ HOSPITAL LAB): SARS Coronavirus 2: NEGATIVE

## 2018-12-19 LAB — BRAIN NATRIURETIC PEPTIDE: B Natriuretic Peptide: 35.5 pg/mL (ref 0.0–100.0)

## 2018-12-19 LAB — D-DIMER, QUANTITATIVE: D-Dimer, Quant: 1.92 ug/mL-FEU — ABNORMAL HIGH (ref 0.00–0.50)

## 2018-12-19 MED ORDER — PREDNISONE 20 MG PO TABS: 60.0000 mg | ORAL_TABLET | Freq: Every day | ORAL | 0 refills | Status: AC

## 2018-12-19 MED ORDER — IOHEXOL 350 MG/ML SOLN
100.0000 mL | Freq: Once | INTRAVENOUS | Status: AC | PRN
  Administered 2018-12-19: 100 mL via INTRAVENOUS

## 2018-12-19 MED ORDER — HYDROCODONE-ACETAMINOPHEN 5-325 MG PO TABS
1.0000 | ORAL_TABLET | Freq: Once | ORAL | Status: AC
  Administered 2018-12-19: 1 via ORAL
  Filled 2018-12-19: qty 1

## 2018-12-19 MED ORDER — PREDNISONE 20 MG PO TABS
60.0000 mg | ORAL_TABLET | Freq: Once | ORAL | Status: AC
  Administered 2018-12-19: 18:00:00 60 mg via ORAL
  Filled 2018-12-19: qty 3

## 2018-12-19 MED ORDER — AZITHROMYCIN 250 MG PO TABS: 250.0000 mg | ORAL_TABLET | Freq: Every day | ORAL | 0 refills | Status: AC

## 2018-12-19 MED ORDER — AEROCHAMBER PLUS FLO-VU MEDIUM MISC
1.0000 | Freq: Once | Status: AC
  Administered 2018-12-19: 1
  Filled 2018-12-19: qty 1

## 2018-12-19 MED ORDER — SODIUM CHLORIDE (PF) 0.9 % IJ SOLN
INTRAMUSCULAR | Status: AC
  Filled 2018-12-19: qty 50

## 2018-12-19 MED ORDER — SODIUM CHLORIDE 0.9% FLUSH
3.0000 mL | Freq: Once | INTRAVENOUS | Status: AC
  Administered 2018-12-19: 3 mL via INTRAVENOUS

## 2018-12-19 MED ORDER — AZITHROMYCIN 250 MG PO TABS
500.0000 mg | ORAL_TABLET | Freq: Once | ORAL | Status: AC
  Administered 2018-12-19: 18:00:00 500 mg via ORAL
  Filled 2018-12-19: qty 2

## 2018-12-19 MED ORDER — ALBUTEROL SULFATE HFA 108 (90 BASE) MCG/ACT IN AERS
6.0000 | INHALATION_SPRAY | Freq: Once | RESPIRATORY_TRACT | Status: AC
  Administered 2018-12-19: 6 via RESPIRATORY_TRACT
  Filled 2018-12-19: qty 6.7
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
## 2018-12-19 NOTE — ED Notes (Signed)
Patient transported to CT 

## 2018-12-19 NOTE — Discharge Instructions (Signed)
Use your albuterol inhaler every 4 hours for the next 24 hours, then every 4 hours as needed  Take the antibiotic and steroids  Take over-the-counter Tylenol as needed for pain
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
## 2018-12-19 NOTE — ED Provider Notes (Signed)
Assumed care from Dr. Ranae Palms at 4 PM. Briefly, the patient is a 63 y.o. male with PMHx of  has a past medical history of Anxiety, Arthritis, Bipolar disorder (HCC), Cardiomegaly, Chronic hepatitis C (HCC), Cirrhosis (HCC), COPD (chronic obstructive pulmonary disease) (HCC), Gall stones, GERD (gastroesophageal reflux disease), bacterial pneumonia (2015), Hypercholesteremia, Hypertension, Peptic ulcer disease, and Thyroid disease. here with SOB, cough, vague chest tightness. Sent in by doctor for COVID testing. COVID neg, D-Dimer elevated. CT Angio pending. BNP neg. Trop neg. Plan for delta trop @ 6 PM.   Labs Reviewed  BASIC METABOLIC PANEL - Abnormal; Notable for the following components:      Result Value   Glucose, Bld 100 (*)    All other components within normal limits  CBC - Abnormal; Notable for the following components:   RBC 3.74 (*)    HCT 38.7 (*)    MCV 103.5 (*)    MCH 35.6 (*)    Platelets 93 (*)    All other components within normal limits  D-DIMER, QUANTITATIVE (NOT AT [facility]) - Abnormal; Notable for the following components:   D-Dimer, Quant 1.92 (*)    All other components within normal limits  SARS CORONAVIRUS 2 (HOSPITAL ORDER, PERFORMED IN [facility])  TROPONIN I  BRAIN NATRIURETIC PEPTIDE    Clinical Course as of Dec 18 1817  Tue Dec 19, 2018  1656 Assumed care. Briefly, 63 yo M here with SOB. Sent here for eval of COVID, which is negative. CT angio is pending. I reviewed his labs, imaging. CXR is clear. Labs reassuring. Trop neg, EKG Non-ischemic, low concern for ACS. Plan for delta trop, likely d/c if CT Angio is neg.   [CI]  1815 On reassessment, the patient does appear to be wheezing, but is speaking in full sentences in no acute distress.  Vital signs are stable.  His O2 sats are stable.  Will give him prednisone and start empiric treatment for possible COPD exacerbation.  No other acute findings seen on CT.  Troponin is pending.  If negative, will  discharge with COPD treatment and outpatient follow-up.   [CI]    Clinical Course User Index [CI] Shaune Pollack, MD        Shaune Pollack, MD 12/19/18 [phone]

## 2018-12-20 ENCOUNTER — Other Ambulatory Visit: Payer: Self-pay

## 2019-05-30 ENCOUNTER — Emergency Department (HOSPITAL_COMMUNITY): Payer: Medicaid Other

## 2019-05-30 ENCOUNTER — Emergency Department (HOSPITAL_COMMUNITY)
Admission: EM | Admit: 2019-05-30 | Discharge: 2019-05-30 | Disposition: A | Discharge status: Home / Self Care | Payer: Medicaid Other | Attending: Emergency Medicine | Admitting: Emergency Medicine

## 2019-05-30 ENCOUNTER — Encounter (HOSPITAL_COMMUNITY): Payer: Self-pay

## 2019-05-30 DIAGNOSIS — I10 Essential (primary) hypertension: Secondary | ICD-10-CM | POA: Diagnosis not present

## 2019-05-30 DIAGNOSIS — Z79899 Other long term (current) drug therapy: Secondary | ICD-10-CM | POA: Diagnosis not present

## 2019-05-30 DIAGNOSIS — S42251A Displaced fracture of greater tuberosity of right humerus, initial encounter for closed fracture: Secondary | ICD-10-CM

## 2019-05-30 DIAGNOSIS — W19XXXA Unspecified fall, initial encounter: Secondary | ICD-10-CM | POA: Diagnosis not present

## 2019-05-30 DIAGNOSIS — S43014A Anterior dislocation of right humerus, initial encounter: Secondary | ICD-10-CM | POA: Diagnosis not present

## 2019-05-30 DIAGNOSIS — F10930 Alcohol use, unspecified with withdrawal, uncomplicated: Secondary | ICD-10-CM | POA: Diagnosis not present

## 2019-05-30 DIAGNOSIS — J449 Chronic obstructive pulmonary disease, unspecified: Secondary | ICD-10-CM | POA: Insufficient documentation

## 2019-05-30 DIAGNOSIS — F10230 Alcohol dependence with withdrawal, uncomplicated: Secondary | ICD-10-CM

## 2019-05-30 DIAGNOSIS — Y9289 Other specified places as the place of occurrence of the external cause: Secondary | ICD-10-CM | POA: Diagnosis not present

## 2019-05-30 DIAGNOSIS — Y999 Unspecified external cause status: Secondary | ICD-10-CM | POA: Insufficient documentation

## 2019-05-30 DIAGNOSIS — Y9389 Activity, other specified: Secondary | ICD-10-CM | POA: Diagnosis not present

## 2019-05-30 DIAGNOSIS — F17210 Nicotine dependence, cigarettes, uncomplicated: Secondary | ICD-10-CM | POA: Diagnosis not present

## 2019-05-30 DIAGNOSIS — E039 Hypothyroidism, unspecified: Secondary | ICD-10-CM | POA: Diagnosis not present

## 2019-05-30 DIAGNOSIS — S42254A Nondisplaced fracture of greater tuberosity of right humerus, initial encounter for closed fracture: Secondary | ICD-10-CM | POA: Insufficient documentation

## 2019-05-30 DIAGNOSIS — R000 Tachycardia, unspecified: Secondary | ICD-10-CM | POA: Insufficient documentation

## 2019-05-30 DIAGNOSIS — S43004A Unspecified dislocation of right shoulder joint, initial encounter: Secondary | ICD-10-CM

## 2019-05-30 DIAGNOSIS — S4991XA Unspecified injury of right shoulder and upper arm, initial encounter: Secondary | ICD-10-CM | POA: Diagnosis present

## 2019-05-30 LAB — BASIC METABOLIC PANEL
Anion gap: 14 (ref 5–15)
BUN: 8 mg/dL (ref 8–23)
CO2: 20 mmol/L — ABNORMAL LOW (ref 22–32)
Calcium: 8.5 mg/dL — ABNORMAL LOW (ref 8.9–10.3)
Chloride: 104 mmol/L (ref 98–111)
Creatinine, Ser: 0.73 mg/dL (ref 0.61–1.24)
GFR calc Af Amer: >60 mL/min (ref >60)
GFR calc non Af Amer: >60 mL/min (ref >60)
Glucose, Bld: 89 mg/dL (ref 70–99)
Potassium: 3.2 mmol/L — ABNORMAL LOW (ref 3.5–5.1)
Sodium: 138 mmol/L (ref 135–145)

## 2019-05-30 LAB — CBC
HCT: 38 % — ABNORMAL LOW (ref 39.0–52.0)
Hemoglobin: 12.9 g/dL — ABNORMAL LOW (ref 13.0–17.0)
MCH: 36 pg — ABNORMAL HIGH (ref 26.0–34.0)
MCHC: 33.9 g/dL (ref 30.0–36.0)
MCV: 106.1 fL — ABNORMAL HIGH (ref 80.0–100.0)
Platelets: 109 10*3/uL — ABNORMAL LOW (ref 150–400)
RBC: 3.58 MIL/uL — ABNORMAL LOW (ref 4.22–5.81)
RDW: 13.1 % (ref 11.5–15.5)
WBC: 7.3 10*3/uL (ref 4.0–10.5)
nRBC: 0 % (ref 0.0–0.2)

## 2019-05-30 MED ORDER — MORPHINE SULFATE (PF) 4 MG/ML IV SOLN
4.0000 mg | Freq: Once | INTRAVENOUS | Status: AC
  Administered 2019-05-30: 4 mg via INTRAVENOUS
  Filled 2019-05-30: qty 1

## 2019-05-30 MED ORDER — LORAZEPAM 2 MG/ML IJ SOLN
1.0000 mg | Freq: Once | INTRAMUSCULAR | Status: AC
  Administered 2019-05-30: 1 mg via INTRAVENOUS
  Filled 2019-05-30: qty 1

## 2019-05-30 MED ORDER — THIAMINE HCL 100 MG/ML IJ SOLN: 100.0000 mg | Freq: Every day | INTRAMUSCULAR | Status: DC

## 2019-05-30 MED ORDER — POTASSIUM CHLORIDE CRYS ER 20 MEQ PO TBCR: 20.0000 meq | EXTENDED_RELEASE_TABLET | Freq: Every day | ORAL | 0 refills | Status: DC

## 2019-05-30 MED ORDER — SODIUM CHLORIDE 0.9 % IV BOLUS
500.0000 mL | Freq: Once | INTRAVENOUS | Status: AC
  Administered 2019-05-30: 500 mL via INTRAVENOUS

## 2019-05-30 MED ORDER — LORAZEPAM 1 MG PO TABS: 0.0000 mg | ORAL_TABLET | Freq: Four times a day (QID) | ORAL | Status: DC

## 2019-05-30 MED ORDER — ONDANSETRON HCL 4 MG/2ML IJ SOLN
4.0000 mg | Freq: Once | INTRAMUSCULAR | Status: AC
  Administered 2019-05-30: 4 mg via INTRAVENOUS
  Filled 2019-05-30: qty 2

## 2019-05-30 MED ORDER — POTASSIUM CHLORIDE CRYS ER 20 MEQ PO TBCR
40.0000 meq | EXTENDED_RELEASE_TABLET | Freq: Once | ORAL | Status: AC
  Administered 2019-05-30: 40 meq via ORAL
  Filled 2019-05-30: qty 2

## 2019-05-30 MED ORDER — LORAZEPAM 2 MG/ML IJ SOLN: 0.0000 mg | Freq: Four times a day (QID) | INTRAMUSCULAR | Status: DC

## 2019-05-30 MED ORDER — LORAZEPAM 2 MG/ML IJ SOLN: 0.0000 mg | Freq: Two times a day (BID) | INTRAMUSCULAR | Status: DC

## 2019-05-30 MED ORDER — PROPOFOL 10 MG/ML IV BOLUS
100.0000 mg | Freq: Once | INTRAVENOUS | Status: AC
  Administered 2019-05-30: 200 mg via INTRAVENOUS
  Filled 2019-05-30: qty 20

## 2019-05-30 MED ORDER — LORAZEPAM 1 MG PO TABS: 0.0000 mg | ORAL_TABLET | Freq: Two times a day (BID) | ORAL | Status: DC

## 2019-05-30 MED ORDER — HYDROCODONE-ACETAMINOPHEN 5-325 MG PO TABS: 1.0000 | ORAL_TABLET | Freq: Four times a day (QID) | ORAL | 0 refills | Status: DC | PRN

## 2019-05-30 MED ORDER — LORAZEPAM 2 MG/ML IJ SOLN
2.0000 mg | Freq: Once | INTRAMUSCULAR | Status: AC
  Administered 2019-05-30: 2 mg via INTRAVENOUS
  Filled 2019-05-30: qty 1

## 2019-05-30 MED ORDER — VITAMIN B-1 100 MG PO TABS: 100.0000 mg | ORAL_TABLET | Freq: Every day | ORAL | Status: DC

## 2019-05-30 NOTE — Discharge Instructions (Addendum)
You were seen in the emergency department today for passing out with a shoulder dislocation and fracture.  We have reduced your shoulder dislocation.  We have sent you in [place] for pain control related to the fracture.  -Norco-this is a narcotic/controlled substance medication that has potential addicting qualities.  We recommend that you take 1-2 tablets every 6 hours as needed for severe pain.  Do not drive or operate heavy machinery when taking this medicine as it can be sedating. Do not take sedating medications when taking this medicine for safety reasons.  Keep this out of reach of small children.  Please be aware this medicine has Tylenol in it (325 mg/tab) do not exceed the maximum dose of Tylenol in a day per over the counter recommendations should you decide to supplement with Tylenol over the counter.  Try to decrease your alcohol intake with this medicine, be careful when using it as each of these are sedating.  We have prescribed you new medication(s) today. Discuss the medications prescribed today with your pharmacist as they can have adverse effects and interactions with your other medicines including over the counter and prescribed medications. Seek medical evaluation if you start to experience new or abnormal symptoms after taking one of these medicines, seek care immediately if you start to experience difficulty breathing, feeling of your throat closing, facial swelling, or rash as these could be indications of a more serious allergic reaction  We have also sent in a prescription for potassium supplement this was low.  Please follow-up with your primary care provider or your neurologist in regards to your passing out episodes. Please follow-up with Dr. Debroah Loop office, orthopedic surgery, this Friday morning 06/01/2019 * 8:30 for your shoulder injury  Return to the ER for new or worsening symptoms including but not limited to worsening pain, numbness, weakness, chest pain, trouble  breathing, recurrence of passing out, hallucinations, inability to keep fluids down, seizure activity, or any other concerns.

## 2019-05-30 NOTE — ED Notes (Signed)
Patient given water and sandwich. 

## 2019-05-30 NOTE — ED Triage Notes (Addendum)
Arrived by [facility]; homeless. Patient reports he fell last night around 9PM injuring right shoulder. Patient denies hitting head, denies loss of consciousness, endorses alcohol consumption last night but not today. A&O X4. EMS reports patient ambulatory to ambulance with right shoulder 'hanging". Received 200 mcg Fentanyl IV by EMS

## 2019-05-30 NOTE — ED Provider Notes (Signed)
1530: hand off from previous team. See previous note for full H&P.   Light headedness and syncope last night in setting of ETOH. Drinks ETOH daily, no food in last 3 days.  Per patient he has seen PCP and neurology for light headedness and passing out episodes in the past.   He has right shoulder anterior dislocation and greater tuberosity fracture.  Reduced successfully by previous team.  Dr Percell Miller with ortho consulted and recommended CT and f/u in office.    Tachcyardia onset while in ER after reduction procedure suspect to be from withdrawal.   Initial CIWA score=1 per RN.    He has received ativan 1 mg, 1 L IVF, morphine, zofran.    Plan to reassess, plan to dc with ortho fu, pain control if tachycardia improves. Patient plans on continuing drinking and not interested in detox at this time.  No need to wait for CT results.   Physical Exam  BP (!) 161/105   Pulse (!) 109   Temp 98.8 F (37.1 C)   Resp 17   Ht 5\' 11"  (1.803 m)   Wt 77.1 kg   SpO2 100%   BMI 23.71 kg/m   Physical Exam Vitals signs and nursing note reviewed.  Constitutional:      Appearance: He is well-developed.     Comments: Sitting on side of bed, alert. Cooperative.   HENT:     Head: Normocephalic and atraumatic.     Right Ear: External ear normal.     Left Ear: External ear normal.     Nose: Nose normal.  Eyes:     Conjunctiva/sclera: Conjunctivae normal.  Neck:     Musculoskeletal: Normal range of motion and neck supple.  Cardiovascular:     Rate and Rhythm: Normal rate and regular rhythm.     Heart sounds: Normal heart sounds.     Comments: 1+ radial pulses bilaterally  Pulmonary:     Effort: Pulmonary effort is normal.     Breath sounds: Normal breath sounds.  Musculoskeletal:     Comments: Ecchymosis to right anterior shoulder/humerus.  Patient is moving right upper extremity and shoulder despite being in splint. Splint rearranged and explained to patient how to use it/avoid movement of  shoulder.   Skin:    General: Skin is warm and dry.     Capillary Refill: Capillary refill takes less than 2 seconds.  Neurological:     Mental Status: He is alert and oriented to person, place, and time.     Comments: Speaking in full sentences.  Stands up and ambulates in room without difficulty or ataxia.    Psychiatric:        Behavior: Behavior normal.        Thought Content: Thought content normal.        Judgment: Judgment normal.     ED Course/Procedures     Procedures  MDM   1545: Went to see patient he is in CT. Per RN pt has been ambulatory, eating and in no distress.   1703: HR at 1645 is 107.  No clinical decline.  Pt requesting discharge to catch the bus and a sandwich.  He admits he will likely start drinking again soon because of "everything going on". He was told to call orthopedist to arrange follow up.  Return precautions given.       Kinnie Feil, PA-C 05/30/19 1705    Fredia Sorrow, MD 06/06/19 [phone]

## 2019-05-30 NOTE — ED Provider Notes (Signed)
Medical screening examination/treatment/procedure(s) were conducted as a shared visit with non-physician practitioner(s) and myself.  I personally evaluated the patient during the encounter.  EKG Interpretation  Date/Time:  Wednesday May 30 2019 12:42:36 EDT Ventricular Rate:  94 PR Interval:    QRS Duration: 90 QT Interval:  371 QTC Calculation: 464 R Axis:   65 Text Interpretation:  Sinus rhythm Abnormal R-wave progression, early transition Baseline wander in lead(s) V3 No significant change since last tracing Confirmed by Linwood Dibbles [phone]) on 05/30/2019 1:01:27 PM  .Sedation  Date/Time: 05/30/2019 1:45 PM Performed by: Linwood Dibbles, MD Authorized by: Linwood Dibbles, MD   Consent:    Consent obtained:  Verbal   Consent given by:  Patient   Risks discussed:  Allergic reaction, dysrhythmia, inadequate sedation, nausea, prolonged hypoxia resulting in organ damage, prolonged sedation necessitating reversal, respiratory compromise necessitating ventilatory assistance and intubation and vomiting   Alternatives discussed:  Analgesia without sedation, anxiolysis and regional anesthesia Universal protocol:    Procedure explained and questions answered to patient or proxy's satisfaction: yes     Relevant documents present and verified: yes     Test results available and properly labeled: yes     Imaging studies available: yes     Required blood products, implants, devices, and special equipment available: yes     Site/side marked: yes     Immediately prior to procedure a time out was called: yes     Patient identity confirmation method:  Verbally with patient Indications:    Procedure necessitating sedation performed by:  Physician performing sedation Pre-sedation assessment:    Time since last food or drink:  12   ASA classification: class 1 - normal, healthy patient     Neck mobility: normal     Mouth opening:  3 or more finger widths   Thyromental distance:  4 finger widths  Mallampati score:  II - soft palate, uvula, fauces visible   Pre-sedation assessments completed and reviewed: airway patency, cardiovascular function, hydration status, mental status, nausea/vomiting, pain level, respiratory function and temperature     Pre-sedation assessment completed:  05/30/2019 1:06 PM Immediate pre-procedure details:    Reassessment: Patient reassessed immediately prior to procedure     Reviewed: vital signs, relevant labs/tests and NPO status     Verified: bag valve mask available, emergency equipment available, intubation equipment available, IV patency confirmed, oxygen available and suction available   Procedure details (see MAR for exact dosages):    Preoxygenation:  Nasal cannula   Sedation:  Propofol   Intended level of sedation: deep   Intra-procedure monitoring:  Blood pressure monitoring, cardiac monitor, continuous pulse oximetry, frequent LOC assessments, frequent vital sign checks and continuous capnometry   Intra-procedure events: none     Total Provider sedation time (minutes):  20 Post-procedure details:    Post-sedation assessment completed:  05/30/2019 1:46 PM   Attendance: Constant attendance by certified staff until patient recovered     Recovery: Patient returned to pre-procedure baseline     Post-sedation assessments completed and reviewed: airway patency, cardiovascular function, hydration status, mental status, nausea/vomiting, pain level, respiratory function and temperature     Patient is stable for discharge or admission: yes     Patient tolerance:  Tolerated well, no immediate complications .Ortho Injury Treatment  Date/Time: 05/30/2019 1:46 PM Performed by: Linwood Dibbles, MD Authorized by: Linwood Dibbles, MD   Consent:    Consent obtained:  Verbal   Consent given by:  Patient   Risks discussed:  Fracture, recurrent dislocation, restricted joint movement, stiffness and vascular damage   Alternatives discussed:  No treatment Universal  protocol:    Procedure explained and questions answered to patient or proxy's satisfaction: yes     Relevant documents present and verified: yes     Imaging studies available: yes     Required blood products, implants, devices, and special equipment available: yes     Immediately prior to procedure a time out was called: yes     Patient identity confirmed:  Verbally with patientInjury location: shoulder Location details: right shoulder Injury type: fracture-dislocation Pre-procedure neurovascular assessment: neurovascularly intact Pre-procedure neurological function: normal Pre-procedure range of motion: normal  Anesthesia: Local anesthesia used: no  Patient sedated: Yes. Refer to sedation procedure documentation for details of sedation. Manipulation performed: yes Reduction successful: yes X-ray confirmed reduction: yes Post-procedure neurovascular assessment: post-procedure neurovascularly intact Post-procedure distal perfusion: normal Post-procedure neurological function: normal Post-procedure range of motion: normal Patient tolerance: patient tolerated the procedure well with no immediate complications Comments: Reduction performed by myself and PA Blima Dessert, MD 05/30/19 1348
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
## 2019-06-28 DIAGNOSIS — F1010 Alcohol abuse, uncomplicated: Secondary | ICD-10-CM | POA: Insufficient documentation

## 2019-06-28 DIAGNOSIS — M7918 Myalgia, other site: Secondary | ICD-10-CM | POA: Insufficient documentation

## 2019-06-28 DIAGNOSIS — E785 Hyperlipidemia, unspecified: Secondary | ICD-10-CM | POA: Insufficient documentation

## 2019-07-02 ENCOUNTER — Other Ambulatory Visit: Payer: Self-pay

## 2019-07-02 ENCOUNTER — Ambulatory Visit
Admission: RE | Admit: 2019-07-02 | Discharge: 2019-07-02 | Disposition: A | Discharge status: Home / Self Care | Payer: Medicaid Other | Source: Ambulatory Visit | Attending: Internal Medicine | Admitting: Internal Medicine

## 2019-07-02 ENCOUNTER — Other Ambulatory Visit: Payer: Self-pay | Admitting: Internal Medicine

## 2019-07-02 DIAGNOSIS — R05 Cough: Secondary | ICD-10-CM

## 2019-07-02 DIAGNOSIS — R059 Cough, unspecified: Secondary | ICD-10-CM

## 2019-07-09 ENCOUNTER — Other Ambulatory Visit: Payer: Self-pay | Admitting: Internal Medicine

## 2019-07-09 DIAGNOSIS — R918 Other nonspecific abnormal finding of lung field: Secondary | ICD-10-CM

## 2019-07-19 ENCOUNTER — Emergency Department (HOSPITAL_COMMUNITY): Payer: Medicaid Other

## 2019-07-19 ENCOUNTER — Emergency Department (HOSPITAL_COMMUNITY)
Admission: EM | Admit: 2019-07-19 | Discharge: 2019-07-19 | Disposition: A | Discharge status: Home / Self Care | Payer: Medicaid Other | Attending: Emergency Medicine | Admitting: Emergency Medicine

## 2019-07-19 ENCOUNTER — Other Ambulatory Visit: Payer: Self-pay

## 2019-07-19 DIAGNOSIS — W19XXXA Unspecified fall, initial encounter: Secondary | ICD-10-CM | POA: Insufficient documentation

## 2019-07-19 DIAGNOSIS — F17210 Nicotine dependence, cigarettes, uncomplicated: Secondary | ICD-10-CM | POA: Diagnosis not present

## 2019-07-19 DIAGNOSIS — Z79899 Other long term (current) drug therapy: Secondary | ICD-10-CM | POA: Insufficient documentation

## 2019-07-19 DIAGNOSIS — Y999 Unspecified external cause status: Secondary | ICD-10-CM | POA: Diagnosis not present

## 2019-07-19 DIAGNOSIS — S0990XA Unspecified injury of head, initial encounter: Secondary | ICD-10-CM | POA: Diagnosis not present

## 2019-07-19 DIAGNOSIS — E039 Hypothyroidism, unspecified: Secondary | ICD-10-CM | POA: Diagnosis not present

## 2019-07-19 DIAGNOSIS — Y9289 Other specified places as the place of occurrence of the external cause: Secondary | ICD-10-CM | POA: Insufficient documentation

## 2019-07-19 DIAGNOSIS — Z590 Homelessness: Secondary | ICD-10-CM | POA: Insufficient documentation

## 2019-07-19 DIAGNOSIS — F10929 Alcohol use, unspecified with intoxication, unspecified: Secondary | ICD-10-CM | POA: Diagnosis not present

## 2019-07-19 DIAGNOSIS — I10 Essential (primary) hypertension: Secondary | ICD-10-CM | POA: Insufficient documentation

## 2019-07-19 DIAGNOSIS — J449 Chronic obstructive pulmonary disease, unspecified: Secondary | ICD-10-CM | POA: Insufficient documentation

## 2019-07-19 DIAGNOSIS — Y939 Activity, unspecified: Secondary | ICD-10-CM | POA: Diagnosis not present

## 2019-07-19 LAB — CBG MONITORING, ED: Glucose-Capillary: 92 mg/dL (ref 70–99)

## 2019-07-19 NOTE — ED Notes (Signed)
Pt cursing at this Probation officer as well as other staff wanting something to eat or drink. This writer explained to the patient that he can not have anything to eat or drink until the lab results are back. Pt continued to yell and scream and security was called. Pt began to calm down once security was at bedside

## 2019-07-19 NOTE — ED Notes (Signed)
Pt refused EKG at this time stated "theres nothing wrong with my heart lets just save [place] all the trouble". Will attempt again.

## 2019-07-19 NOTE — ED Triage Notes (Signed)
Pt BIB [facility] from shelter. Pt endorses ETOH, but unsure how much. He had a fall. Reports that he hit the back of his head. He is alert and intoxicated at this time.
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
## 2019-07-19 NOTE — ED Notes (Signed)
Patient transported to CT 

## 2019-07-19 NOTE — ED Notes (Signed)
Pt ambulatory to restroom without huge concern. Gate slightly unsteady.

## 2019-07-19 NOTE — ED Notes (Signed)
Pt yelling and cursing at staff to give him something to eat or he will leave. Pt asked repeatedly to stay calm and not yell and curse at staff. Pt refusing and continuing to curse and scream at staff. Security standing by.

## 2019-07-20 ENCOUNTER — Other Ambulatory Visit: Payer: Self-pay | Admitting: Internal Medicine

## 2019-07-24 ENCOUNTER — Ambulatory Visit
Admission: RE | Admit: 2019-07-24 | Discharge: 2019-07-24 | Disposition: A | Discharge status: Home / Self Care | Payer: Medicaid Other | Source: Ambulatory Visit | Attending: Internal Medicine | Admitting: Internal Medicine

## 2019-07-24 DIAGNOSIS — R918 Other nonspecific abnormal finding of lung field: Secondary | ICD-10-CM

## 2019-07-24 MED ORDER — IOPAMIDOL (ISOVUE-300) INJECTION 61%
75.0000 mL | Freq: Once | INTRAVENOUS | Status: AC | PRN
  Administered 2019-07-24: 75 mL via INTRAVENOUS

## 2019-07-25 ENCOUNTER — Encounter: Payer: Self-pay | Admitting: Critical Care Medicine

## 2019-07-25 ENCOUNTER — Other Ambulatory Visit: Payer: Self-pay | Admitting: Critical Care Medicine

## 2019-07-25 DIAGNOSIS — M10072 Idiopathic gout, left ankle and foot: Secondary | ICD-10-CM | POA: Insufficient documentation

## 2019-07-25 MED ORDER — MELOXICAM 15 MG PO TABS: 15.0000 mg | ORAL_TABLET | Freq: Every day | ORAL | 0 refills | Status: DC

## 2019-07-25 MED ORDER — COLCHICINE 0.6 MG PO TABS: 0.6000 mg | ORAL_TABLET | Freq: Every day | ORAL | 0 refills | Status: DC

## 2019-07-25 NOTE — Progress Notes (Signed)
Colchicine for gout flare and Meloxicam

## 2019-07-25 NOTE — Progress Notes (Signed)
Patient ID: Mark Marshall, male   DOB: 03-01-1956, 63 y.o.   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

## 2019-07-27 ENCOUNTER — Other Ambulatory Visit: Payer: Self-pay | Admitting: Pharmacist

## 2019-07-27 DIAGNOSIS — M10072 Idiopathic gout, left ankle and foot: Secondary | ICD-10-CM

## 2019-07-27 MED ORDER — MITIGARE 0.6 MG PO CAPS: ORAL_CAPSULE | ORAL | 0 refills | Status: DC

## 2019-07-30 ENCOUNTER — Other Ambulatory Visit: Payer: Self-pay

## 2019-07-30 DIAGNOSIS — Z20822 Contact with and (suspected) exposure to covid-19: Secondary | ICD-10-CM

## 2019-07-31 ENCOUNTER — Other Ambulatory Visit: Payer: Self-pay | Admitting: Critical Care Medicine

## 2019-07-31 LAB — NOVEL CORONAVIRUS, NAA: SARS-CoV-2, NAA: NOT DETECTED

## 2019-08-13 ENCOUNTER — Telehealth: Payer: Self-pay

## 2019-08-25 NOTE — Telephone Encounter (Signed)
Mark Marshall picked up his medication on 1/11 after several attempts at verification.

## 2019-09-05 ENCOUNTER — Other Ambulatory Visit: Payer: Self-pay | Admitting: Critical Care Medicine

## 2019-09-05 ENCOUNTER — Other Ambulatory Visit: Payer: Self-pay

## 2019-09-05 ENCOUNTER — Encounter: Payer: Self-pay | Admitting: Critical Care Medicine

## 2019-09-05 DIAGNOSIS — I10 Essential (primary) hypertension: Secondary | ICD-10-CM

## 2019-09-05 MED ORDER — LEVOTHYROXINE SODIUM 175 MCG PO TABS: 175.0000 ug | ORAL_TABLET | Freq: Every day | ORAL | 1 refills | Status: AC

## 2019-09-05 MED ORDER — LEVETIRACETAM 500 MG PO TABS: 500.0000 mg | ORAL_TABLET | Freq: Two times a day (BID) | ORAL | 0 refills | Status: AC

## 2019-09-05 MED ORDER — CYCLOBENZAPRINE HCL 5 MG PO TABS: 5.0000 mg | ORAL_TABLET | Freq: Three times a day (TID) | ORAL | 1 refills | Status: AC

## 2019-09-05 MED ORDER — OMEPRAZOLE 40 MG PO CPDR: 40.0000 mg | DELAYED_RELEASE_CAPSULE | Freq: Every day | ORAL | 2 refills | Status: AC

## 2019-09-05 MED ORDER — LISINOPRIL 20 MG PO TABS: 40.0000 mg | ORAL_TABLET | Freq: Every day | ORAL | 1 refills | Status: AC

## 2019-09-05 MED ORDER — GABAPENTIN 300 MG PO CAPS: 300.0000 mg | ORAL_CAPSULE | Freq: Three times a day (TID) | ORAL | 3 refills | Status: AC

## 2019-09-05 NOTE — Congregational Nurse Program (Signed)
Mark Marshall at walk-in clinic c/o L knee pain. Seen by Dr. Delford Field, who will provide medication RX and referral to an orthopedist. An appoint will also be made as f/u at [facility] by CN. Medication will pick-up by CN at [facility]

## 2019-09-05 NOTE — Progress Notes (Unsigned)
Refills

## 2019-09-06 ENCOUNTER — Other Ambulatory Visit: Payer: Self-pay | Admitting: Critical Care Medicine

## 2019-09-06 DIAGNOSIS — M79671 Pain in right foot: Secondary | ICD-10-CM

## 2019-09-06 NOTE — Progress Notes (Signed)
Patient ID: Mark Marshall, male   DOB: [birth year], 64 y.o.   MRN: [id number] This is a 64 year old male who has histories of hypertension, prior cavitary pneumonia left upper lobe, COPD, gallbladder disease, reflux, hepatic cirrhosis, peptic ulcer disease, gout, arthropathy of the left knee, alcohol use, bipolar disorder, cocaine use in the past, ongoing tobacco use, posttraumatic stress disorder, hyperlipidemia and homeless  He is seen today in the homeless shelter clinic at [facility].  He had side effects from colchicine so this has been discontinued and he states his left knee pain and his foot pain are still persisting despite meloxicam.  On exam blood pressure is 186/102 pulse is 90 saturations 98%  I reviewed the patient's medications and look at his current medications that he had in his medicine bag and he is not taking any blood pressure medicine at this time.  He supposed to be on lisinopril 40 mg daily however he is not taking any at this time  His left knee has significant lateral and medial collateral ligament damage and is very unstable with significant bone-on-bone arthropathy  It does not appear he has acute gout but rather he has foot pain in the arch and also in the toe area and would benefit from a podiatry referral with imaging  I refilled today for this patient the Flexeril 5 mg 3 times daily, gabapentin 300 mg 3 times daily, ibuprofen 600 mg 3 times daily, Keppra 500 mg twice daily, Synthroid 175 mcg daily, lisinopril samples were given to the patient such that he can take 220 mg tablets daily, omeprazole 40 mg daily,  I do need to get this patient into the clinic for further examination and review and will [facility] also make a referral to podiatry and obtain for the patient a left knee brace at some point we will need to get him in with orthopedics as well

## 2019-09-25 ENCOUNTER — Ambulatory Visit: Payer: Medicaid Other | Admitting: Critical Care Medicine

## 2019-09-25 NOTE — Progress Notes (Deleted)
   Subjective:    Patient ID: Mark Marshall, male    DOB: December 06, 1955, 64 y.o.   MRN: [id number]  64 y.o.M with Copd, hx of PNA, HTN, GERD, PUD, ETOH, hypothyroidism, arthropathy of Left knee,  Foot pain with ? Gout on Left great toe.  Hepatic cirrhosis.  [facility]   Last ED visit 07/19/2019 for ETOH intox        Review of Systems     Objective:   Physical Exam        Assessment & Plan:

## 2019-10-02 ENCOUNTER — Ambulatory Visit: Payer: Self-pay | Admitting: Podiatry

## 2020-02-07 DEATH — deceased

## 2020-02-28 ENCOUNTER — Telehealth: Payer: Self-pay | Admitting: Internal Medicine

## 2020-02-28 NOTE — Telephone Encounter (Signed)
Death certificate was received from [employer].

## 2020-03-12 IMAGING — CT CT CHEST W/ CM
1 series · 15 of 33 positions shown, 19 images · IV contrast (APPLIED)
Comparison: 12/19/2018

CLINICAL DATA: Followup abnormal chest x-ray. Possible right lung
lesion.

EXAM:
CT CHEST WITH CONTRAST
TECHNIQUE: Multidetector CT imaging of the chest was performed during
intravenous contrast administration.
CONTRAST:  75mL [id number] IOPAMIDOL ([id number]) INJECTION 61%

[Series 2: chest w/cm · axial · 0.79mm/px · z∈[-367,-49]mm · 15 of 187 slices shown, 19 images]
[im 14/187  mediastinal]
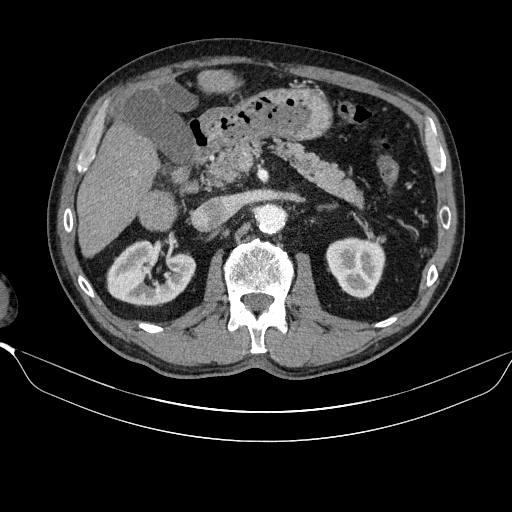
[im 14/187  lung]
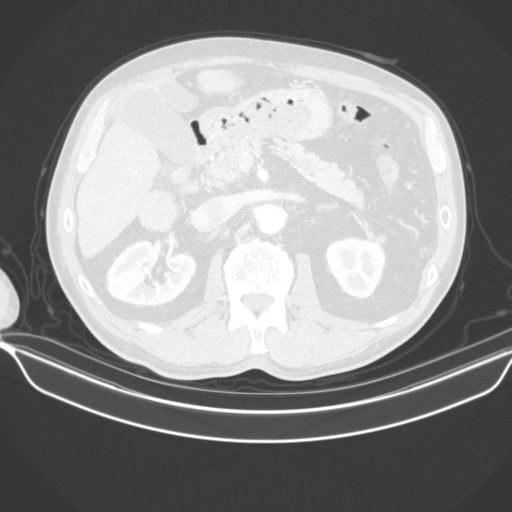
[im 28/187  lung]
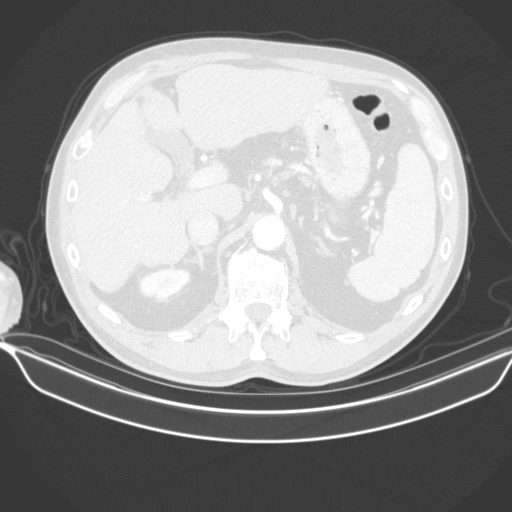
[im 38/187  lung]
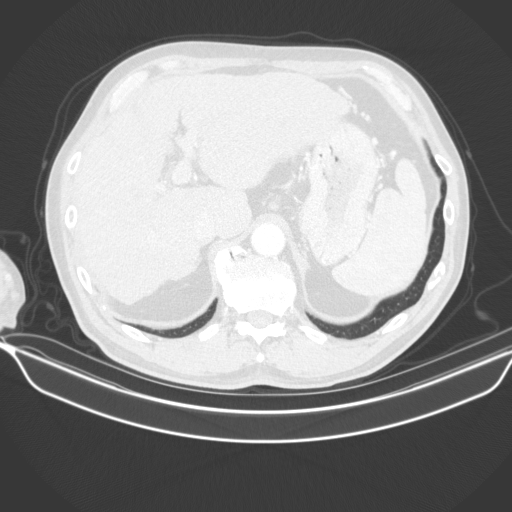
[im 49/187  lung]
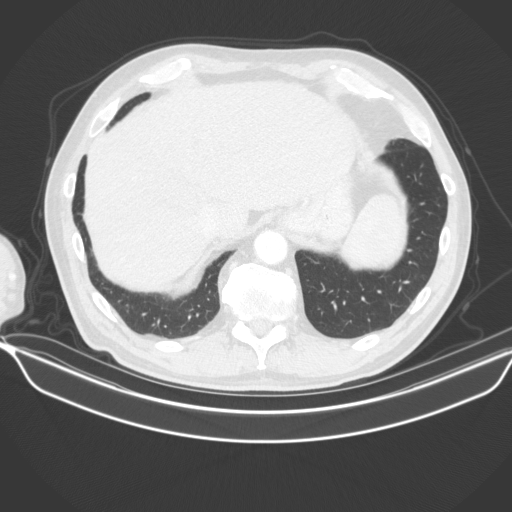
[im 63/187  mediastinal]
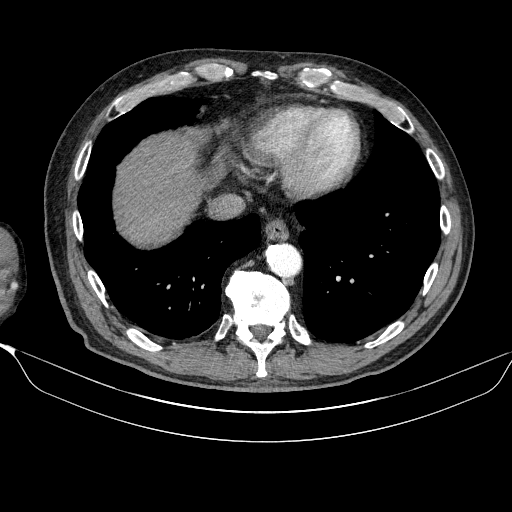
[im 63/187  lung]
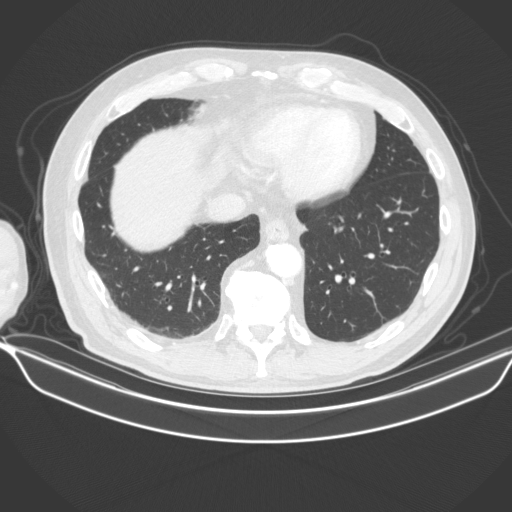
[im 75/187  lung]
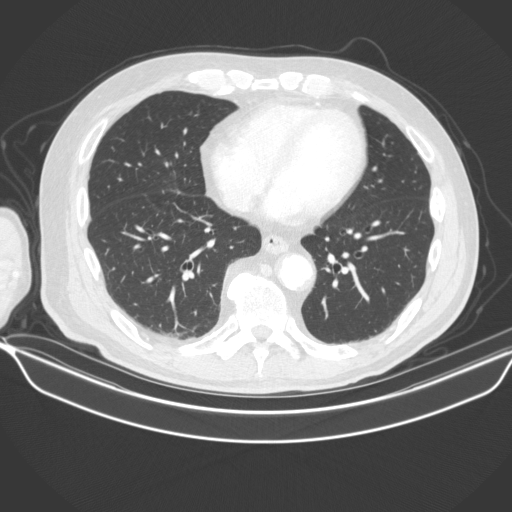
[im 83/187  lung]
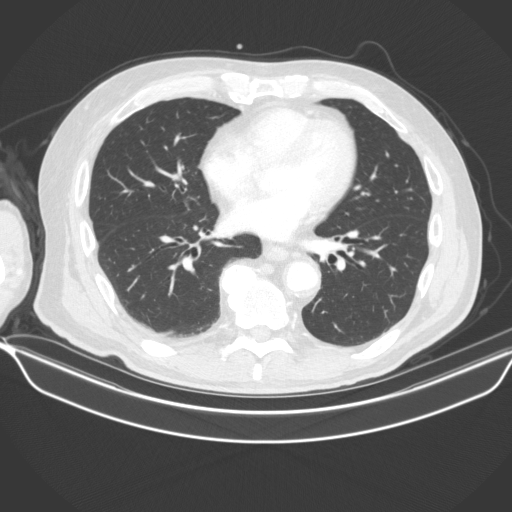
[im 97/187  lung]
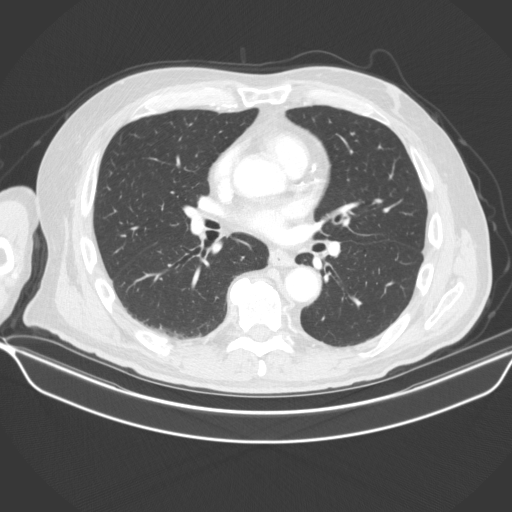
[im 104/187  mediastinal]
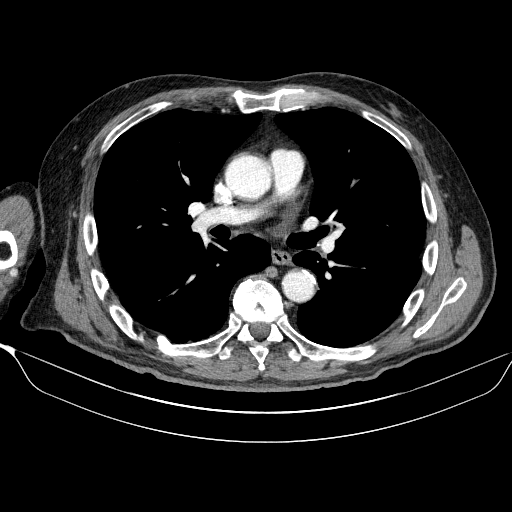
[im 104/187  lung]
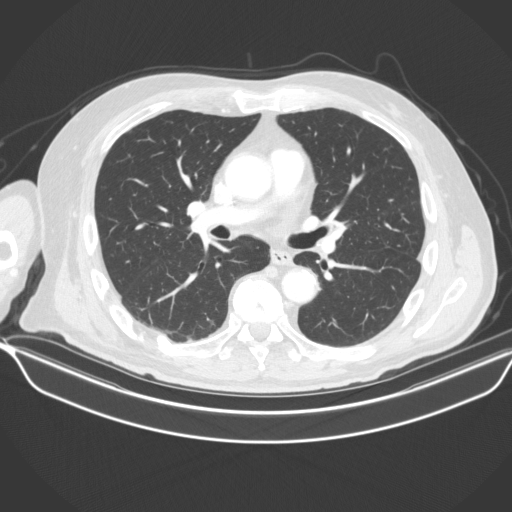
[im 112/187  lung]
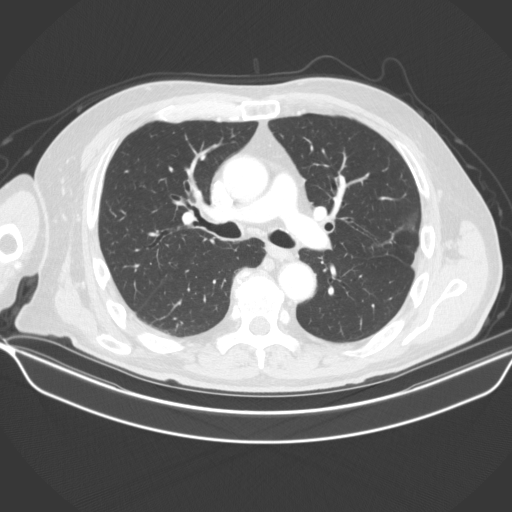
[im 125/187  lung]
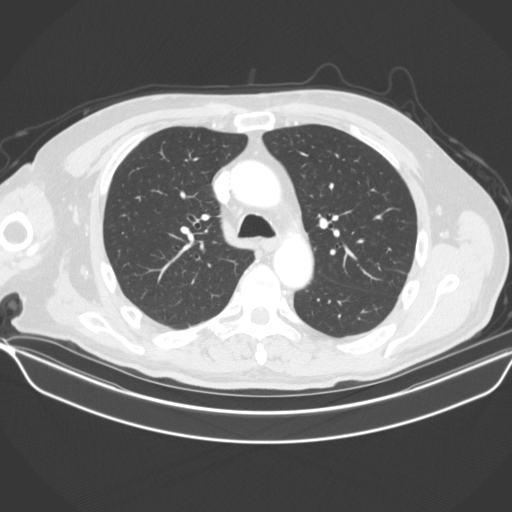
[im 138/187  lung]
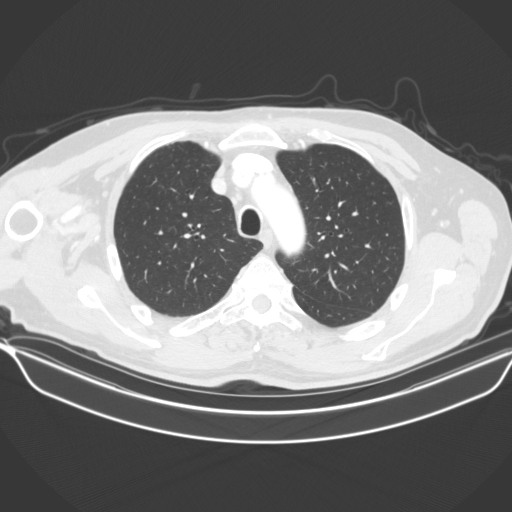
[im 149/187  mediastinal]
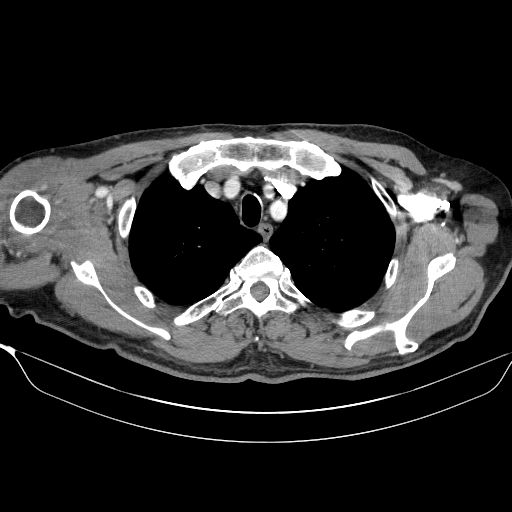
[im 149/187  lung]
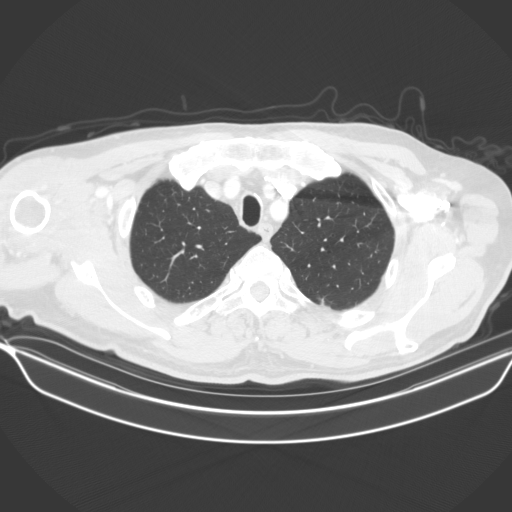
[im 159/187  lung]
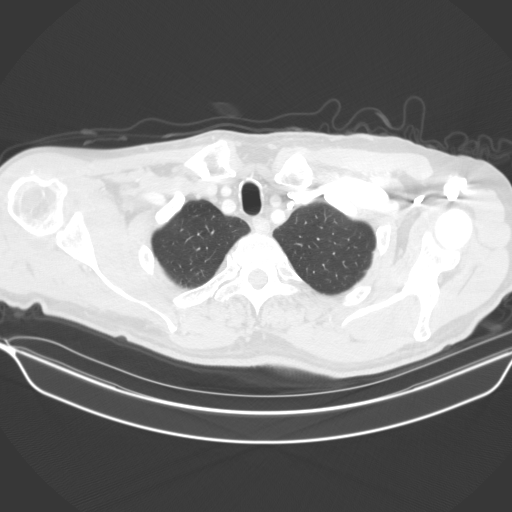
[im 173/187  lung]
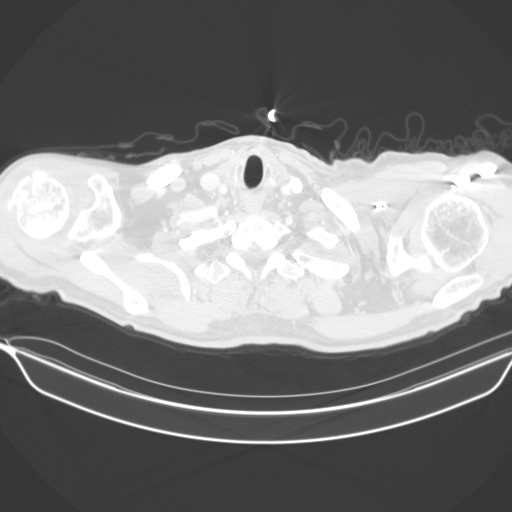

[15 of 33 positions shown; findings below may reference images not displayed]

FINDINGS: Cardiovascular: The heart is normal in size. No pericardial
effusion. There is mild tortuosity and mild to moderate
calcification of the thoracic aorta. No aneurysm or dissection. The
branch vessels are patent. Age advanced coronary artery
calcifications are noted.

Mediastinum/Nodes: No mediastinal or hilar mass or lymphadenopathy.
Small scattered lymph nodes are noted. The esophagus is grossly
normal. The thyroid gland is likely surgically absent.

Lungs/Pleura: Stable emphysematous changes. No acute overlying
pulmonary process. No worrisome pulmonary lesions. No pleural
effusion or pleural lesions.

Upper Abdomen: Cirrhotic changes involving the liver are with
irregular hepatic contour, dilated hepatic fissures, right hepatic
lobe atrophy and caudate lobe enlargement. There is also evidence of
portal venous hypertension with portal venous collaterals, early
esophageal varices and splenomegaly but no ascites. No focal hepatic
lesions. A few small calcified granulomas are noted. The portal and
splenic veins are patent.

Small gallstones are noted in the gallbladder.

Musculoskeletal: No chest wall mass, supraclavicular or axillary
adenopathy. The bony structures are intact. There is a healing right
posterior eighth rib fracture with early callus formation likely
accounting for the patient's chest x-ray abnormality.
IMPRESSION: 1. Emphysematous changes but no acute pulmonary findings or
worrisome pulmonary lesions.
2. There is a healing right posterior eighth rib fracture with
callus formation likely accounting for the abnormality seen on the
recent chest x-ray.
3. Age advanced coronary artery calcifications.
4. Advanced cirrhotic changes involving the liver as detailed above.
No worrisome hepatic lesions.

Aortic Atherosclerosis (D623I-BYP.P) and Emphysema (D623I-5NB.3).
